# Patient Record
Sex: Male | Born: 1965 | Race: Black or African American | Hispanic: No | Marital: Married | Smoking: Never smoker
Health system: Southern US, Community
[De-identification: ages and names within clinical notes are randomized; demographics above are authoritative.]

## PROBLEM LIST (undated history)

## (undated) DIAGNOSIS — I10 Essential (primary) hypertension: Secondary | ICD-10-CM

## (undated) DIAGNOSIS — R002 Palpitations: Secondary | ICD-10-CM

## (undated) DIAGNOSIS — E785 Hyperlipidemia, unspecified: Secondary | ICD-10-CM

## (undated) HISTORY — DX: Palpitations: R00.2

## (undated) HISTORY — DX: Hyperlipidemia, unspecified: E78.5

## (undated) HISTORY — DX: Essential (primary) hypertension: I10

---

## 2002-03-12 ENCOUNTER — Encounter: Payer: Self-pay | Admitting: Ophthalmology

## 2002-03-12 ENCOUNTER — Ambulatory Visit: Admit: 2002-03-12 | Discharge: 2002-03-12 | Payer: Self-pay | Admitting: Ophthalmology

## 2003-09-10 ENCOUNTER — Encounter: Payer: Self-pay | Admitting: Emergency Medicine

## 2003-09-10 ENCOUNTER — Ambulatory Visit (HOSPITAL_COMMUNITY): Admission: RE | Admit: 2003-09-10 | Discharge: 2003-09-10 | Payer: Self-pay | Admitting: Emergency Medicine

## 2013-06-30 ENCOUNTER — Encounter: Payer: Self-pay | Admitting: Cardiology

## 2013-06-30 ENCOUNTER — Ambulatory Visit (INDEPENDENT_AMBULATORY_CARE_PROVIDER_SITE_OTHER): Payer: PRIVATE HEALTH INSURANCE | Admitting: Cardiology

## 2013-06-30 VITALS — BP 130/100 | HR 64 | Ht 74.5 in | Wt 230.9 lb

## 2013-06-30 DIAGNOSIS — R9431 Abnormal electrocardiogram [ECG] [EKG]: Secondary | ICD-10-CM | POA: Insufficient documentation

## 2013-06-30 DIAGNOSIS — R002 Palpitations: Secondary | ICD-10-CM | POA: Insufficient documentation

## 2013-06-30 NOTE — Progress Notes (Signed)
06/30/2013   PCP: Kari Baars, MD   Chief Complaint  Patient presents with  . NP-CONSULT    PALPITATIONS    Primary Cardiologist: Dr. 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 They started a day or so ago he was concerned it was atrial fibrillation.  No chest pain, no shortness of breath no lightheadedness or dizziness. Recent complete physical by Dr. Eric Form and lab work was done at that time.  Over the weekend he exercised Saturday and Sunday on the treadmill getting his heart rate up to 120 beats per minute without any complications.  EKG today is abnormal from l2008 with T-wave inversions in V3 V4 V5 continued T wave inversions in II,III, & aVF he does have PACs on the monitor.  Allergies  Allergen Reactions  . Promethazine     Current Outpatient Prescriptions  Medication Sig Dispense Refill  . ibuprofen (ADVIL,MOTRIN) 400 MG tablet Take 400 mg by mouth as needed for pain.       No current facility-administered medications for this visit.    History reviewed. No pertinent past medical history.  borderline hypertension. He's had some readings as high as the 190s/100s, but when he checks his blood pressures at home, there always in the 120s to 130s systolic. He also announces that he may have borderline dyslipidemia.   History reviewed. No pertinent past surgical history. He does have a family history of premature coronary disease. He says that his mother had a heart attack in her 69s.  YQM:VHQIONG:EX colds or fevers, no weight changes Skin:no rashes or ulcers HEENT:no blurred vision, no congestion CV:see HPI PUL:see HPI GI:no diarrhea constipation or melena, no indigestion GU:no hematuria, no dysuria MS:no joint pain, no claudication Neuro:no syncope, no lightheadedness Endo:no diabetes, no thyroid disease Has one cup of caffeine a day Exercises without chest pain or SOB  PHYSICAL EXAM BP  130/100  Pulse 64  Ht 6' 2.5" (1.892 m)  Wt 230 lb 14.4 oz (104.736 kg)  BMI 29.26 kg/m2 General:Pleasant affect, NAD Skin:Warm and dry, brisk capillary refill HEENT:normocephalic, sclera clear, mucus membranes moist Neck:supple, no JVD, no bruits  Heart:S1S2 RRR without murmur, gallup, rub or click, occ premature beat Lungs:clear without rales, rhonchi, or wheezes BMW:UXLK, non tender, + BS, do not palpate liver spleen or masses Ext:no lower ext edema, 2+ pedal pulses, 2+ radial pulses Neuro:alert and oriented, MAE, follows commands, + facial symmetry   EKG:see above  ASSESSMENT AND PLAN Palpitations PACs here in office.  No atrial fib.  If symptoms do not improve then he may need event monitor.  Recent labs with Dr. Clelia Croft.  Echo with normal LV function, 09/2012.  Abnormal EKG Has abnormal EKG but no chest pain and can achieve HR of 160 on treadmill without complications. Dr. Herbie Baltimore discussed with pt. Do not feel need for stress test at this time.     I spent ~15-20 minutes talking with the patient, who is an ER MD.  He is very fit & healthy, exercises ~3 days a week, routinely getting his HR well into the 160s and denies any CP or dyspnea.    He comes in today due to increased frequency of palpitations over the past few days. He has had family friends in town, and has not been getting very good sleep. He also notes that work has been somewhat stressful of late. Over last few days he's been having these frequent spells  of palpitations with brief pauses and at the clinic at attention. But not that run together. He definitely is not felt any tachyarrhythmias. His main concern is that he wanted x-ray did not have atrial fibrillation basilar frequency of his premature beats.  He has an EKG here that the a sinus rhythm with PAC. He does have some somewhat ischemic appearing ST-T wave changes in the anterior leads on his ECG, which if wasn't in any one else without his significant exercise  history and no evidence of angina, I would be somewhat concerned with what is considered evaluating this the stress test. However his daily activity at the gym is more than a stress test would be.  Exam is relatively benign and normal. It personally palpated at least 15 PACs during about 2 minutes of palpating his pulse.  I think we safely say that these PACs are not at her fibrillation. He was relieved with this, and does not really want to go through any further evaluation with a monitor or stress test.  At no time has he ever had any chest tightness chest pressure.  He'll Sunday followup with Dr. Allyson Sabal as scheduled.  Marykay Lex, MD

## 2013-06-30 NOTE — Assessment & Plan Note (Addendum)
Has abnormal EKG but no chest pain and can achieve HR of 160 on treadmill without complications. Dr. Herbie Baltimore discussed with pt, who does not feel that there is a need for stress test at this time.

## 2013-06-30 NOTE — Assessment & Plan Note (Signed)
PACs here in office.  No atrial fib.  If symptoms do not improve then he may need event monitor.  Recent labs with Dr. Clelia Croft.  Echo with normal LV function, 09/2012.

## 2013-06-30 NOTE — Patient Instructions (Addendum)
Call if increasing palpitations or any problems.

## 2013-07-03 ENCOUNTER — Encounter: Payer: Self-pay | Admitting: Cardiovascular Disease

## 2014-02-13 ENCOUNTER — Ambulatory Visit (INDEPENDENT_AMBULATORY_CARE_PROVIDER_SITE_OTHER): Payer: PRIVATE HEALTH INSURANCE | Admitting: Internal Medicine

## 2014-02-13 VITALS — BP 142/88 | HR 67 | Ht 74.0 in | Wt 228.0 lb

## 2014-02-13 DIAGNOSIS — I493 Ventricular premature depolarization: Secondary | ICD-10-CM

## 2014-02-13 DIAGNOSIS — I4949 Other premature depolarization: Secondary | ICD-10-CM

## 2014-02-13 DIAGNOSIS — R9431 Abnormal electrocardiogram [ECG] [EKG]: Secondary | ICD-10-CM

## 2014-02-13 DIAGNOSIS — R002 Palpitations: Secondary | ICD-10-CM

## 2014-02-13 DIAGNOSIS — I10 Essential (primary) hypertension: Secondary | ICD-10-CM

## 2014-02-13 MED ORDER — METOPROLOL SUCCINATE ER 25 MG PO TB24: 12.5000 mg | ORAL_TABLET | Freq: Every day | ORAL | Status: DC

## 2014-02-13 NOTE — Patient Instructions (Signed)
STOP amlodipine.   START Toprol XL 12.5mg  once daily.   Your physician has requested that you have an exercise tolerance test. For further information please visit [email or web address]. Please also follow instruction sheet, as given. PLEASE TAKE TOPROL THE DAY OF THIS TEST.   Please schedule a follow up visit with Dr. Allyson SabalBerry after your test.

## 2014-02-16 ENCOUNTER — Encounter: Payer: Self-pay | Admitting: Internal Medicine

## 2014-02-16 DIAGNOSIS — I10 Essential (primary) hypertension: Secondary | ICD-10-CM | POA: Insufficient documentation

## 2014-02-16 NOTE — Progress Notes (Signed)
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 He reports that he had palpitations in the past for which she is fairly symptomatic. They seem to come and go to periods of time are more noticeable than others. His only other past medical history significant for hypertension, for which she takes low-dose amlodipine. He reports being fairly intolerant to a number of different medications. He continues to be active in fact exercises regularly. When he noticed more palpitations he other week he started to exercise more to see if he could suppress the palpitations, which she was able to do to some extent. Yesterday however the palpitations were so intense and frequent that he felt that he needed to come to the office, instead of the emergency department.  He denies any chest pain with this, or with exertion.  PMHx:  Past Medical History  Diagnosis Date  . Hypertension   . Palpitations     History reviewed. No pertinent past surgical history.  FAMHx:  Family History  Problem Relation Age of Onset  . Hypertension Father   . Hypertension Mother   . CAD Neg Hx     SOCHx:   reports that he has never smoked. He has never used smokeless tobacco. He reports that he drinks about 3.0 ounces of alcohol per week. He reports that he does not use illicit drugs.  ALLERGIES:  Allergies  Allergen Reactions  . Promethazine     ROS: A comprehensive review of systems was negative except for: Cardiovascular: positive for palpitations  HOME MEDS: Current Outpatient Prescriptions  Medication Sig Dispense Refill  . ibuprofen (ADVIL,MOTRIN) 400 MG tablet Take 400 mg by mouth as needed for pain.      . metoprolol succinate (TOPROL-XL) 25 MG 24 hr tablet Take 0.5 tablets (12.5 mg total) by mouth daily.  15 tablet  6    No current facility-administered medications for this visit.    LABS/IMAGING: No results found for this or any previous visit (from the past 48 hour(s)). No results found.  VITALS: BP 142/88  Pulse 67  Ht 6\' 2"  (1.88 m)  Wt 228 lb (103.42 kg)  BMI 29.26 kg/m2  EXAM: General appearance: alert and no distress Neck: no carotid bruit and no JVD Lungs: clear to auscultation bilaterally Heart: regular rate and rhythm, S1, S2 normal and occasional irregularity is noted Abdomen: soft, non-tender; bowel sounds normal; no masses,  no organomegaly Extremities: extremities normal, atraumatic, no cyanosis or edema Pulses: 2+ and symmetric Skin: Skin color, texture, turgor normal. No rashes or lesions Neurologic: Grossly normal Psych: Mildly anxious  EKG: Sinus rhythm at 69 with PVCs, inferior and lateral T-wave inversions with voltage criteria for LVH  ASSESSMENT: 1. Palpitations/PVCs 2. Abnormal EKG with inferior and lateral T-wave inversions-unchanged from prior EKGs 3. No prior stress testing  PLAN: 1.   Eddie [name] is indeed having PVCs for which is very symptomatic. He does think that this was suppressed with exercise, which would suggest they're more benign. He's never had a formal stress test therefore I recommend an exercise treadmill stress test. In addition, I convinced him to try a low-dose beta blocker. I would recommend discontinuing his Norvasc and starting Toprol-XL 12.5 mg daily. I would like him to continue this medication even on the day of his stress test to see how  it affects his PVCs in exercise tolerance.  He will followup with Dr. Allyson SabalBerry per his request.  Eddie NoseKenneth C. Hilty, MD, [facility] Southern New MexicoFACC Attending Cardiologist [facility]  [name],Eddie C 02/16/2014, 7:00 PM

## 2014-02-25 ENCOUNTER — Other Ambulatory Visit: Payer: Self-pay | Admitting: *Deleted

## 2014-02-25 DIAGNOSIS — I517 Cardiomegaly: Secondary | ICD-10-CM

## 2014-03-03 ENCOUNTER — Encounter (HOSPITAL_COMMUNITY): Payer: PRIVATE HEALTH INSURANCE

## 2014-03-12 ENCOUNTER — Ambulatory Visit (HOSPITAL_COMMUNITY): Payer: PRIVATE HEALTH INSURANCE

## 2014-03-16 ENCOUNTER — Ambulatory Visit (HOSPITAL_COMMUNITY)
Admission: RE | Admit: 2014-03-16 | Discharge: 2014-03-16 | Disposition: A | Discharge status: Home / Self Care | Payer: PRIVATE HEALTH INSURANCE | Source: Ambulatory Visit | Attending: Cardiovascular Disease | Admitting: Cardiovascular Disease

## 2014-03-16 DIAGNOSIS — I517 Cardiomegaly: Secondary | ICD-10-CM

## 2014-03-16 NOTE — Progress Notes (Signed)
2D Echo Performed 03/16/2014    Ivo Moga, RCS  

## 2014-03-18 ENCOUNTER — Encounter: Payer: Self-pay | Admitting: Cardiovascular Disease

## 2014-03-18 ENCOUNTER — Ambulatory Visit (INDEPENDENT_AMBULATORY_CARE_PROVIDER_SITE_OTHER): Payer: PRIVATE HEALTH INSURANCE | Admitting: Cardiovascular Disease

## 2014-03-18 VITALS — BP 140/80 | HR 64 | Ht 74.0 in | Wt 234.0 lb

## 2014-03-18 DIAGNOSIS — E785 Hyperlipidemia, unspecified: Secondary | ICD-10-CM | POA: Insufficient documentation

## 2014-03-18 DIAGNOSIS — R002 Palpitations: Secondary | ICD-10-CM

## 2014-03-18 DIAGNOSIS — I10 Essential (primary) hypertension: Secondary | ICD-10-CM

## 2014-03-18 DIAGNOSIS — R9431 Abnormal electrocardiogram [ECG] [EKG]: Secondary | ICD-10-CM

## 2014-03-18 NOTE — Assessment & Plan Note (Signed)
Followed by his PCP 

## 2014-03-18 NOTE — Patient Instructions (Signed)
Dr Allyson SabalBerry will see back as needed.

## 2014-03-18 NOTE — Assessment & Plan Note (Signed)
Consistent with left ventricular hypertrophy with strain pattern. Recent 2D  echo did confirm mild concentric LVH.

## 2014-03-18 NOTE — Assessment & Plan Note (Signed)
Unifocal PVCs in a trigeminal pattern in the past. I believe these are bradycardic mediated. Since she's been doing more aerobic activity these have become less frequent and noticeable

## 2014-03-18 NOTE — Assessment & Plan Note (Signed)
Under good control and her medications 

## 2014-03-18 NOTE — Progress Notes (Signed)
     03/18/2014 Eddie [name]   07/11/1966  [id number]  Primary Physician Martha ClanShaw, William, MD Primary Cardiologist: Runell GessJonathan J. 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 He was seen by Dr. [place]had T. In the recent past because of symptomatic palpitations. Factors include hypertension and hyperlipidemia. He denies chest pain or shortness of breath. He is very active and exercises her on a regular basis. EKG showed bigeminal PVCs and a beta blocker was recommended however the patient try this and felt clinically worse. 2-D echo revealed mild concentric left ventricular hypertrophy which probably explains his abnormal EKG.   Current Outpatient Prescriptions  Medication Sig Dispense Refill  . amLODipine (NORVASC) 2.5 MG tablet Take 2.5 mg by mouth daily.      Marland Kitchen. docusate sodium (COLACE) 100 MG capsule Take 100 mg by mouth daily as needed for mild constipation.      Marland Kitchen. ibuprofen (ADVIL,MOTRIN) 400 MG tablet Take 400 mg by mouth as needed for pain.       No current facility-administered medications for this visit.    Allergies  Allergen Reactions  . Promethazine     History   Social History  . Marital Status: Married    Spouse Name: N/A    Number of Children: N/A  . Years of Education: N/A   Occupational History  . Not on file.   Social History Main Topics  . Smoking status: Never Smoker   . Smokeless tobacco: Never Used     Comment: occ cigar  . Alcohol Use: 3.0 oz/week    6 drink(s) per week  . Drug Use: No  . Sexual Activity: Not on file   Other Topics Concern  . Not on file   Social History Narrative  . No narrative on file     Review of Systems: General: negative for chills, fever, night sweats or weight changes.  Cardiovascular: negative for chest pain, dyspnea on exertion, edema, orthopnea, palpitations, paroxysmal nocturnal dyspnea or  shortness of breath Dermatological: negative for rash Respiratory: negative for cough or wheezing Urologic: negative for hematuria Abdominal: negative for nausea, vomiting, diarrhea, bright red blood per rectum, melena, or hematemesis Neurologic: negative for visual changes, syncope, or dizziness All other systems reviewed and are otherwise negative except as noted above.    Blood pressure 140/80, pulse 64, height 6\' 2"  (1.88 m), weight 234 lb (106.142 kg).  General appearance: alert and no distress Neck: no adenopathy, no carotid bruit, no JVD, supple, symmetrical, trachea midline and thyroid not enlarged, symmetric, no tenderness/mass/nodules Lungs: clear to auscultation bilaterally Heart: regular rate and rhythm, S1, S2 normal, no murmur, click, rub or gallop Extremities: extremities normal, atraumatic, no cyanosis or edema  EKG not performed today  ASSESSMENT AND PLAN:   HTN (hypertension) Under good control and her medications  Hyperlipidemia Followed by his PCP  Palpitations Unifocal PVCs in a trigeminal pattern in the past. I believe these are bradycardic mediated. Since she's been doing more aerobic activity these have become less frequent and noticeable  Abnormal EKG Consistent with left ventricular hypertrophy with strain pattern. Recent 2D  echo did confirm mild concentric LVH.      Runell GessJonathan J. Jolleen Seman MD FACP,FACC,FAHA, [facility] LLCFSCAI 03/18/2014 4:52 PM

## 2016-09-14 ENCOUNTER — Ambulatory Visit (INDEPENDENT_AMBULATORY_CARE_PROVIDER_SITE_OTHER): Payer: PRIVATE HEALTH INSURANCE | Admitting: Neurology

## 2016-09-14 ENCOUNTER — Encounter: Payer: Self-pay | Admitting: Neurology

## 2016-09-14 VITALS — BP 143/90 | HR 80 | Resp 16 | Ht 74.0 in | Wt 236.0 lb

## 2016-09-14 DIAGNOSIS — R351 Nocturia: Secondary | ICD-10-CM

## 2016-09-14 DIAGNOSIS — R0683 Snoring: Secondary | ICD-10-CM

## 2016-09-14 DIAGNOSIS — G4726 Circadian rhythm sleep disorder, shift work type: Secondary | ICD-10-CM | POA: Diagnosis not present

## 2016-09-14 DIAGNOSIS — R51 Headache: Secondary | ICD-10-CM

## 2016-09-14 DIAGNOSIS — R519 Headache, unspecified: Secondary | ICD-10-CM

## 2016-09-14 NOTE — Patient Instructions (Signed)

## 2016-09-14 NOTE — Progress Notes (Signed)
Subjective:    Patient ID: REMIGIO MCMILLON is a 50 y.o. male.  HPI     Huston Foley, MD, PhD [facility] [address], [address] [address] [place], [place] [address]  Dear Dr. Clelia Croft,   I saw your patient, Hezekiah Veltre, upon your kind request, in my neurologic clinic today for initial consultation of his sleep disorder, in particular, concern for underlying obstructive sleep apnea. The patient is unaccompanied today. 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 He has woken himself up with a sense snoring and from his own snoring. 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 He tries to go to bed before midnight, sometimes his shift will last until 1 AM or later and he will go to bed later and has difficulty falling asleep at the time.wakeup time is around 7 AM. He has one side who goes to preschool. He is not aware of any family history of OSA. He has nocturia about 3-4 times per night but attributes this to drinking a lot of water. He tries to work out at least 3 days a week. He has occasional trouble falling asleep and does not like to take medication, has not tried melatonin lately, tried Ambien in the past but did not like the way it made him feel. Secondary to his work-related schedule, his sleep will be irregular, he does not always wake up rested, denies restless leg symptoms and is not aware of any twitching or kicking in his sleep. He has very occasional morning headaches, sometimes once a week however. He does not smoke, drinks 1 cup of coffee per day, does not use any illicit drugs and drinks alcohol maybe  6 drinks a week. His Epworth sleepiness score is 3 out of 24 today, his fatigue score is 12 out of 63. He tries not to nap but when he naps he tries to limit his sleep to less than 1 hour. I reviewed your office note from 08/25/2016, which you kindly included.  His Past Medical History Is Significant For: Past Medical History:  Diagnosis Date  . Hyperlipidemia   . Hypertension   . Palpitations    PVCs    His Past Surgical History Is Significant For: No past surgical history on file.  His Family History Is Significant For: Family History  Problem Relation Age of Onset  . Hypertension Father   . Hypertension Mother   . CAD Neg Hx     His Social History Is Significant For: Social History   Social History  . Marital status: Married    Spouse name: N/A  . Number of children: 1  . Years of education: MD   Occupational History  . [employer]    Social History Main Topics  . Smoking status: Never Smoker  . Smokeless tobacco: Never Used     Comment: occ cigar  . Alcohol use 3.0 oz/week    6 drink(s) per week  . Drug use: No  . Sexual activity: Not Asked   Other Topics Concern  . None   Social History Narrative   Drinks about 1.5 caffeine drinks a day     His Allergies Are:  Allergies  Allergen  Reactions  . Promethazine   :   His Current Medications Are:  Outpatient Encounter Prescriptions as of 09/14/2016  Medication Sig  . amLODipine (NORVASC) 2.5 MG tablet Take 2.5 mg by mouth daily.  Marland Kitchen docusate sodium (COLACE) 100 MG capsule Take 100 mg by mouth daily as needed for mild constipation.  Marland Kitchen ibuprofen (ADVIL,MOTRIN) 400 MG tablet Take 400 mg by mouth as needed for pain.  . rosuvastatin (CRESTOR) 10 MG tablet    No facility-administered encounter medications on file as of 09/14/2016.   :  Review of Systems:  Out of a complete 14 point review of systems, all are reviewed and negative with the exception of these symptoms as listed below:   Review of Systems   Neurological:       Patient has trouble falling and staying asleep, snoring, sometimes wakes up feeling tired, morning headaches, sometimes has daytime fatigue, sometimes takes naps.  Shift work: hours very   Epworth Sleepiness Scale 0= would never doze 1= slight chance of dozing 2= moderate chance of dozing 3= high chance of dozing  Sitting and reading:1 Watching TV:0 Sitting inactive in a public place (ex. Theater or meeting):0 As a passenger in a car for an hour without a break:1 Lying down to rest in the afternoon:1 Sitting and talking to someone:0 Sitting quietly after lunch (no alcohol):0 In a car, while stopped in traffic:0 Total:3  Objective:  Neurologic Exam  Physical Exam Physical Examination:   Vitals:   09/14/16 1525  BP: (!) 143/90  Pulse: 80  Resp: 16   General Examination: The patient is a very pleasant 50 y.o. male in no acute distress. He appears well-developed and well-nourished and very well groomed.    HEENT: Normocephalic, atraumatic, pupils are equal, round and reactive to light and accommodation. Funduscopic exam is normal with sharp disc margins noted. Contacts in place. Extraocular tracking is good without limitation to gaze excursion or nystagmus noted. Normal smooth pursuit is noted. Hearing is grossly intact. Tympanic membranes are clear bilaterally. Face is symmetric with normal facial animation and normal facial sensation. Speech is clear with no dysarthria noted. There is no hypophonia. There is no lip, neck/head, jaw or voice tremor. Neck is supple with full range of passive and active motion. There are no carotid bruits on auscultation. Oropharynx exam reveals: no mouth dryness, good dental hygiene and mild airway crowding, due to tonsils in place, uvula wider . Mallampati is class I. Tongue protrudes centrally and palate elevates symmetrically. Tonsils are 1+ in size. Neck size is 16.5 inches. He has a Mild overbite. Nasal inspection reveals no  significant nasal mucosal bogginess or redness and no septal deviation.   Chest: Clear to auscultation without wheezing, rhonchi or crackles noted.  Heart: S1+S2+0, regular and normal without murmurs, rubs or gallops noted.   Abdomen: Soft, non-tender and non-distended with normal bowel sounds appreciated on auscultation.  Extremities: There is no pitting edema in the distal lower extremities bilaterally. Pedal pulses are intact.  Skin: Warm and dry without trophic changes noted.   Musculoskeletal: exam reveals no obvious joint deformities, tenderness or joint swelling or erythema.   Neurologically:  Mental status: The patient is awake, alert and oriented in all 4 spheres. His immediate and remote memory, attention, language skills and fund of knowledge are appropriate. There is no evidence of aphasia, agnosia, apraxia or anomia. Speech is clear with normal prosody and enunciation. Thought process is linear. Mood is normal and affect is normal.  Cranial nerves  II - XII are as described above under HEENT exam. In addition: shoulder shrug is normal with equal shoulder height noted. Motor exam: Normal bulk, strength and tone is noted. There is no drift, tremor or rebound. Romberg is negative. Reflexes are 2+ throughout. Fine motor skills and coordination: intact with normal finger taps, normal hand movements, normal rapid alternating patting, normal foot taps and normal foot agility.  Cerebellar testing: No dysmetria or intention tremor on finger to nose testing. Heel to shin is unremarkable bilaterally. There is no truncal or gait ataxia.  Sensory exam: intact light touch, pinprick, vibration, temperature sense.  Gait, station, balance, posture: He stands easily. No veering to one side is noted. No leaning to one side is noted. Posture is age-appropriate and stance is narrow based. Gait shows normal stride length and normal pace. No problems turning are noted. Tandem walk is unremarkable.    Assessment and Plan:  In summary, Toy Bakernthony T Germani is a very pleasant 50 y.o.-year old male with an underlying medical history of hypertension, PVCs, left ventricular hypertrophy, hyperlipidemia, history of gout, recurrent iritis and overweight state, whose history and physical exam are concerning for obstructive sleep apnea (OSA). I had a long chat with the patient about my findings and the diagnosis of OSA, its prognosis and treatment options. We talked about medical treatments, surgical interventions and non-pharmacological approaches. I explained in particular the risks and ramifications of untreated moderate to severe OSA, especially with respect to developing cardiovascular disease down the Road, including congestive heart failure, difficult to treat hypertension, cardiac arrhythmias, or stroke. Even type 2 diabetes has, in part, been linked to untreated OSA. Symptoms of untreated OSA include daytime sleepiness, memory problems, mood irritability and mood disorder such as depression and anxiety, lack of energy, as well as recurrent headaches, especially morning headaches. We talked about trying to maintain a healthy lifestyle in general, as well as the importance of weight control. I encouraged the patient to eat healthy, exercise daily and keep well hydrated, to keep a scheduled bedtime and wake time routine, to not skip any meals and eat healthy snacks in between meals. I advised the patient not to drive when feeling sleepy. Of course a confounding factor is his variable work schedule and shift work sleep disorder. I recommended the following at this time: sleep study with potential positive airway pressure titration. (We will score hypopneas at 3% and split the sleep study into diagnostic and treatment portion, if the estimated. 2 hour AHI is >15/h).   I explained the sleep test procedure to the patient and also outlined possible surgical and non-surgical treatment options of OSA, including the use  of a custom-made dental device (which would require a referral to a specialist dentist or oral surgeon), upper airway surgical options, such as pillar implants, radiofrequency surgery, tongue base surgery, and UPPP (which would involve a referral to an ENT surgeon). Rarely, jaw surgery such as mandibular advancement may be considered.  I also explained the CPAP treatment option to the patient, who indicated that he would be willing to try CPAP if the need arises, but would likely prefer a dental appliance. I answered all his questions today and the patient was in agreement. I would like to see him back after the sleep study is completed and encouraged him to call with any interim questions, concerns, problems or updates.   Thank you very much for allowing me to participate in the care of this nice patient. If I can be of any  further assistance to you please do not hesitate to call me at [phone].  Sincerely,   Huston Foley, MD, PhD

## 2016-11-06 ENCOUNTER — Ambulatory Visit (INDEPENDENT_AMBULATORY_CARE_PROVIDER_SITE_OTHER): Payer: PRIVATE HEALTH INSURANCE | Admitting: Neurology

## 2016-11-06 DIAGNOSIS — G4733 Obstructive sleep apnea (adult) (pediatric): Secondary | ICD-10-CM | POA: Diagnosis not present

## 2016-11-06 DIAGNOSIS — G4761 Periodic limb movement disorder: Secondary | ICD-10-CM

## 2016-11-06 DIAGNOSIS — G4720 Circadian rhythm sleep disorder, unspecified type: Secondary | ICD-10-CM

## 2016-11-21 NOTE — Procedures (Signed)
PATIENT'S NAME:  Eddie [name], Fredric DOB:      12-04-1966      MR#:    [id number]     DATE OF RECORDING: 11/06/2016 REFERRING M.D.:  Carolin CoyWillam Shaw, MD Study Performed:   Baseline Polysomnogram HISTORY:  50 year old man with an underlying medical history of hypertension, PVCs, left ventricular hypertrophy, hyperlipidemia, history of gout, recurrent iritis and overweight state, who reports snoring and excessive daytime somnolence as well as nocturia, irregular sleep secondary to shift work. The patient endorsed the Epworth Sleepiness Scale at 3/24 points. The patient's weight 236 pounds with a height of 74 (inches), resulting in a BMI of 30.3 kg/m2. The patient's neck circumference measured 16.5 inches.  CURRENT MEDICATIONS: Amlodipine, Docusate Sodium, Ibuprofen and Rosuvastatin   PROCEDURE:  This is a multichannel digital polysomnogram utilizing the Somnostar 11.2 system.  Electrodes and sensors were applied and monitored per AASM Specifications.   EEG, EOG, Chin and Limb EMG, were sampled at 200 Hz.  ECG, Snore and Nasal Pressure, Thermal Airflow, Respiratory Effort, CPAP Flow and Pressure, Oximetry was sampled at 50 Hz. Digital video and audio were recorded.      BASELINE STUDY  Lights Out was at 22:39 and Lights On at 05:13.  Total recording time (TRT) was 395 minutes, with a total sleep time (TST) of 326.5 minutes.   The patient's sleep latency was 23 minutes.  REM latency was 77 minutes.  The sleep efficiency was 82.7 %.     SLEEP ARCHITECTURE: WASO (Wake after sleep onset) was 53.5 minutes with mild to moderate sleep fragmentation noted.  There were 18 minutes in Stage N1, 219 minutes Stage N2, 0 minutes Stage N3 and 89.5 minutes in Stage REM.  The percentage of Stage N1 was 5.5%, Stage N2 was 67.1%, which is mildly increased, Stage N3 was absent and Stage R (REM sleep) was 27.4%, which very mildly increased.  Audio and video analysis did not show any abnormal or unusual movements, behaviors,  phonations or vocalizations.  The patient took 1 bathroom break. Mild to moderate snoring was noted. The EKG was in keeping with normal sinus rhythm (NSR).  RESPIRATORY ANALYSIS:  There were a total of 30 respiratory events:  0 obstructive apneas, 0 central apneas and 0 mixed apneas with a total of 0 apneas and an apnea index (AI) of 0 /hour. There were 30 hypopneas with a hypopnea index of 5.5 /hour. The patient also had 0 respiratory event related arousals (RERAs).      The total APNEA/HYPOPNEA INDEX (AHI) was 5.5/hour and the total RESPIRATORY DISTURBANCE INDEX was 5.5 /hour.  26 events occurred in REM sleep and 8 events in NREM. The REM AHI was 17.4 /hour, versus a non-REM AHI of 1.. The patient spent 100 minutes of total sleep time in the supine position and 227 minutes in non-supine.. The supine AHI was 14.4 versus a non-supine AHI of 1.6.  OXYGEN SATURATION & C02:  The Wake baseline 02 saturation was 92%, with the lowest being 88%. Time spent below 89% saturation equaled 0 minutes.  PERIODIC LIMB MOVEMENTS:   The patient had a total of 182 Periodic Limb Movements.  The Periodic Limb Movement (PLM) index was 33.4 and the PLM Arousal index was .2/hour.   IMPRESSION: 1. Obstructive Sleep Apnea (OSA) 2. Periodic Limb Movement Disorder (PLMD) 3. Dysfunctions associated with sleep stages or arousal from sleep  RECOMMENDATIONS: 1. This study demonstrates overall mild obstructive sleep apnea, moderate in REM sleep, near moderate during supine sleep with a  total AHI borderline elevated at 5.5/hour, REM AHI of 17.4/hour, supine AHI of 14.4/hour and O2 nadir of 88%. Given the patient's medical history and sleep related complaints, a trial of autoPAP at home may be a reasonable option and will be discussed with the patient. A full night CPAP titration can be considered to optimize therapy. Other treatment options may include avoidance of supine sleep position along with weight loss, upper airway or  jaw surgery in selected patients or the use of an oral appliance in certain patients. ENT evaluation and/or consultation with a maxillofacial surgeon or dentist may be feasible and some instances.    2. The patient should be cautioned not to drive, work at heights, or operate dangerous or heavy equipment when tired or sleepy. Review and reiteration of good sleep hygiene measures should be pursued with any patient. 3. Please note that untreated obstructive sleep apnea carries additional perioperative morbidity. Patients with significant obstructive sleep apnea should receive perioperative PAP therapy and the surgeons and particularly the anesthesiologist should be informed of the diagnosis and the severity of the sleep disordered breathing. 4. Moderate PLMs (periodic limb movements of sleep) were noted during the study without significant arousals; clinical correlation is recommended.  5. This study shows sleep fragmentation and abnormal sleep stage percentages; these are nonspecific findings and per se do not signify an intrinsic sleep disorder or a cause for the patient's sleep-related symptoms. Causes include (but are not limited to) the first night effect of the sleep study, circadian rhythm disturbances, medication effect or an underlying mood disorder or medical problem.  6. The patient will be [facility] discussion of the test results and further management strategies. The referring provider will be notified of the test results.  I certify that I have reviewed the entire raw data recording prior to the issuance of this report in accordance with the Standards of Accreditation of the American Academy of Sleep Medicine (AASM).    Huston FoleySaima Anniebelle Devore, MD, PhD Diplomat, American Board of Psychiatry and Neurology  Diplomat, American Board of Sleep Medicine

## 2016-11-21 NOTE — Progress Notes (Signed)
  Patient referred by Dr. Clelia CroftShaw, seen by me on 09/14/16, diagnostic PSG on 11/06/16.    Please call and notify Dr. Freida BusmanAllen that his recent sleep study did confirm the diagnosis of overall borderline/mild obstructive sleep apnea, moderate in REM sleep, O2 nadir of 88% (so mild desats only). We can consider treating to see if he feels better after treatment. To that end I recommend treatment for this in the form of autoPAP, which means, that we don't have to bring him back for a second sleep study with CPAP, but will let him try an autoPAP machine at home, through a DME company (of his choice, or as per insurance requirement). The DME representative will educate him on how to use the machine, how to put the mask on, etc. I have not yet placed an order, please let me know how he wishes to proceed. Thanks,   Huston FoleySaima Tala Eber, MD, PhD [facility])

## 2016-11-22 ENCOUNTER — Telehealth: Payer: Self-pay

## 2016-11-22 DIAGNOSIS — R0683 Snoring: Secondary | ICD-10-CM

## 2016-11-22 DIAGNOSIS — G4726 Circadian rhythm sleep disorder, shift work type: Secondary | ICD-10-CM

## 2016-11-22 NOTE — Telephone Encounter (Signed)
I spoke to patient and asks if he can try a dental device first? Ok to send referral?

## 2016-11-22 NOTE — Telephone Encounter (Signed)
-----   Message from Huston FoleySaima Athar, MD sent at 11/21/2016  8:26 AM EST -----  Patient referred by Dr. Clelia CroftShaw, seen by me on 09/14/16, diagnostic PSG on 11/06/16.    Please call and notify Dr. Freida BusmanAllen that his recent sleep study did confirm the diagnosis of overall borderline/mild obstructive sleep apnea, moderate in REM sleep, O2 nadir of 88% (so mild desats only). We can consider treating to see if he feels better after treatment. To that end I recommend treatment for this in the form of autoPAP, which means, that we don't have to bring him back for a second sleep study with CPAP, but will let him try an autoPAP machine at home, through a DME company (of his choice, or as per insurance requirement). The DME representative will educate him on how to use the machine, how to put the mask on, etc. I have not yet placed an order, please let me know how he wishes to proceed. Thanks,   Huston FoleySaima Athar, MD, PhD [facility])

## 2016-11-23 NOTE — Telephone Encounter (Signed)
Per Dr. Frances FurbishAthar, ok to try dental device. Patient asked if I can send him information on the dentist in the area that make these. He asked that I send these to him in a letter. Confirmed address and mailed letter today.

## 2016-11-30 NOTE — Addendum Note (Signed)
Addended by: Geronimo RunningINKINS, Luisa Louk A on: 11/30/2016 09:51 AM   Modules accepted: Orders

## 2016-11-30 NOTE — Telephone Encounter (Signed)
Received a fax from Dr. Ninetta LightsHatcher, DDS, office, requesting that a referral to them be placed for a dental device and to include sleep study results. Pt had signed a MR release form for us so that we may release this information to Dr. Ninetta LightsHatcher. Referral placed and sleep study faxed along with Dr. Rutha BouchardAthar's RX for dental device and signature.

## 2019-08-19 ENCOUNTER — Telehealth: Payer: Self-pay

## 2019-08-19 DIAGNOSIS — Z136 Encounter for screening for cardiovascular disorders: Secondary | ICD-10-CM

## 2019-08-19 NOTE — Telephone Encounter (Signed)
Lorretta Harp, MD  Annita Brod, RN        Please schedule Dr. Zenia Resides to have a coronary calcium score   JJB     Received staff message from Dr. Gwenlyn Found on 8/25. Order in [place]. Encounter routed to scheduling

## 2019-09-02 ENCOUNTER — Telehealth: Payer: Self-pay

## 2019-10-06 ENCOUNTER — Other Ambulatory Visit: Payer: Self-pay

## 2019-10-06 ENCOUNTER — Ambulatory Visit (INDEPENDENT_AMBULATORY_CARE_PROVIDER_SITE_OTHER)
Admission: RE | Admit: 2019-10-06 | Discharge: 2019-10-06 | Disposition: A | Discharge status: Home / Self Care | Payer: Self-pay | Source: Ambulatory Visit | Attending: Cardiovascular Disease | Admitting: Cardiovascular Disease

## 2019-10-06 DIAGNOSIS — Z136 Encounter for screening for cardiovascular disorders: Secondary | ICD-10-CM

## 2019-12-31 NOTE — Telephone Encounter (Signed)
CALCIUM SCORE COMPLETED 10/06/2019

## 2021-07-30 IMAGING — CT CT HEART SCORING
2 series · 15 of 20 positions shown, 17 images · non-contrast
Comparison: None.
COMPARISON: None.
COMPARISON: None.

Addendum:
EXAM:
OVER-READ INTERPRETATION  CT CHEST

The following report is an over-read performed by radiologist Dr.
Davida Tiger [REDACTED] on 10/06/2019. This
over-read does not include interpretation of cardiac or coronary
anatomy or pathology. The coronary calcium score interpretation by
the cardiologist is attached.
CLINICAL DATA: Risk stratification
Coronary Calcium Score
TECHNIQUE: The patient was scanned on a Siemens Force scanner. Axial
non-contrast 3 mm slices were carried out through the heart. The
data set was analyzed on a dedicated work station and scored using
the Agatson method.
TECHNIQUE: The patient was scanned on a Siemens Somatom 64 slice scanner. Axial

[Series 2: casc 3.0 i36f 2 bestdiast 70 % · axial · 0.38mm/px · z∈[-235,-103]mm · 8 of 58 slices shown, 10 images]
[im 7/58  vessel]
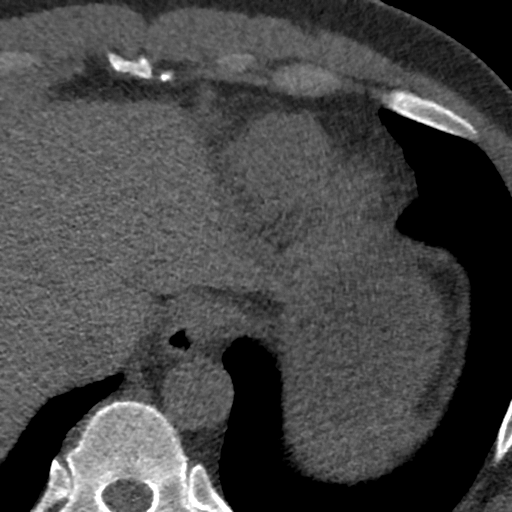
[im 7/58  lung]
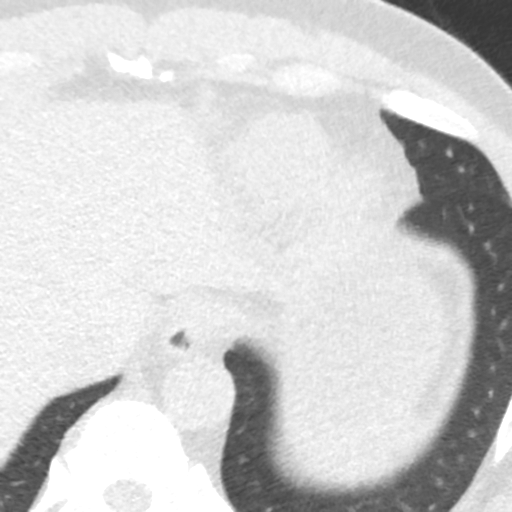
[im 13/58  vessel]
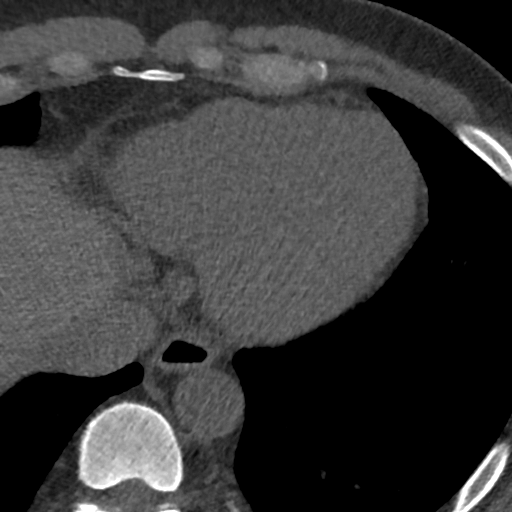
[im 20/58  vessel]
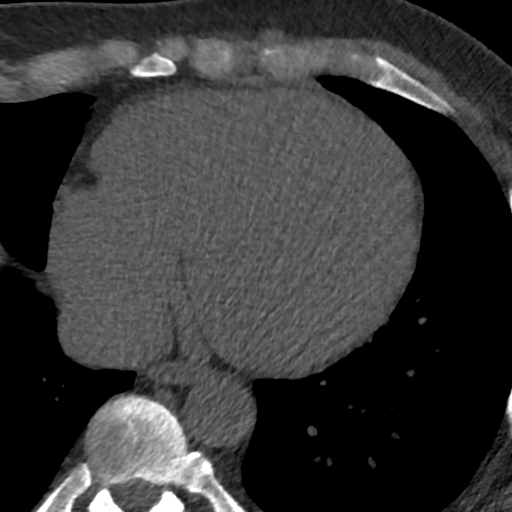
[im 26/58  vessel]
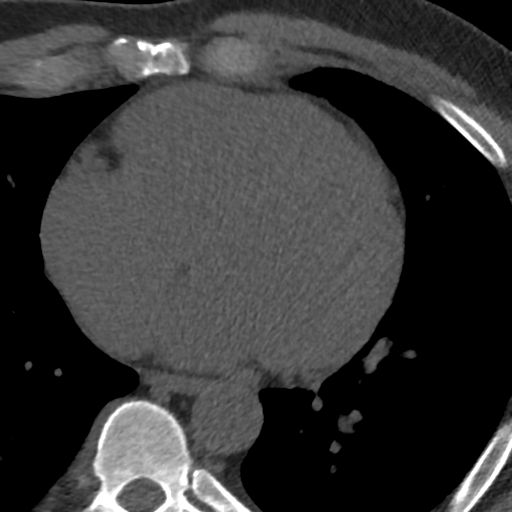
[im 32/58  vessel]
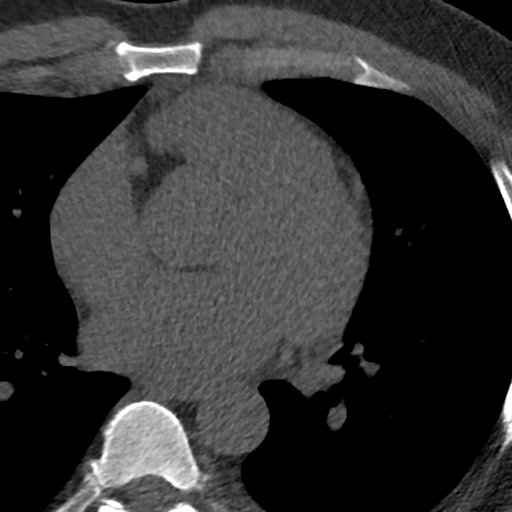
[im 32/58  lung]
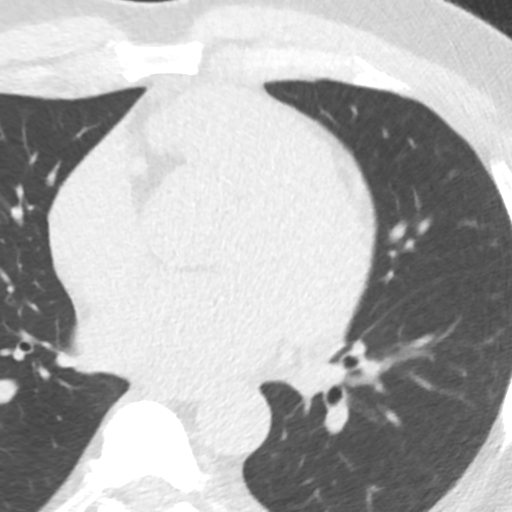
[im 39/58  vessel]
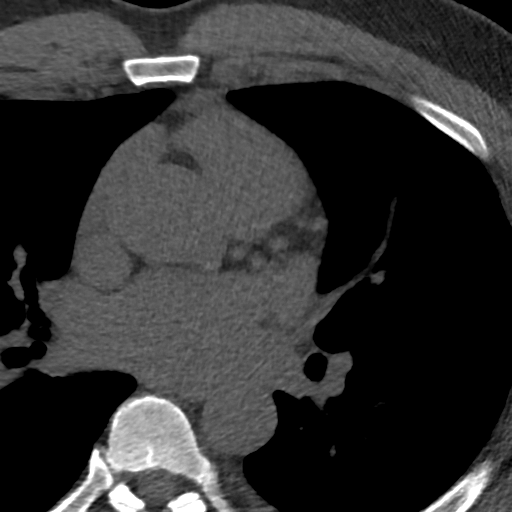
[im 45/58  vessel]
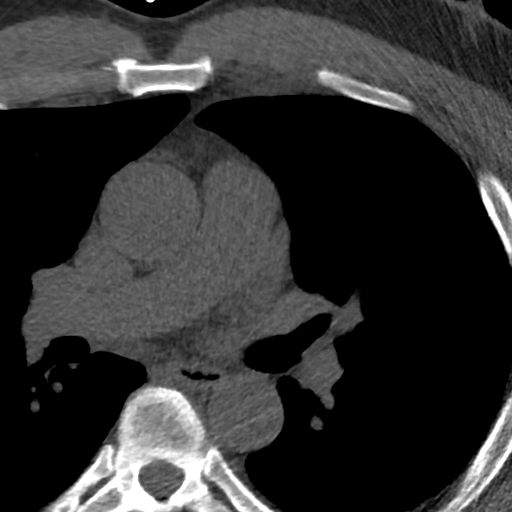
[im 51/58  vessel]
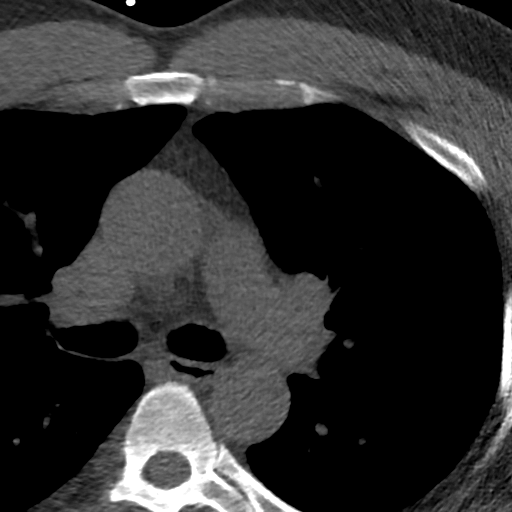

[Series 4: lung st 70 % · axial · 0.77mm/px · z∈[-234,-120]mm · 7 of 58 slices shown]
[im 7/58  lung]
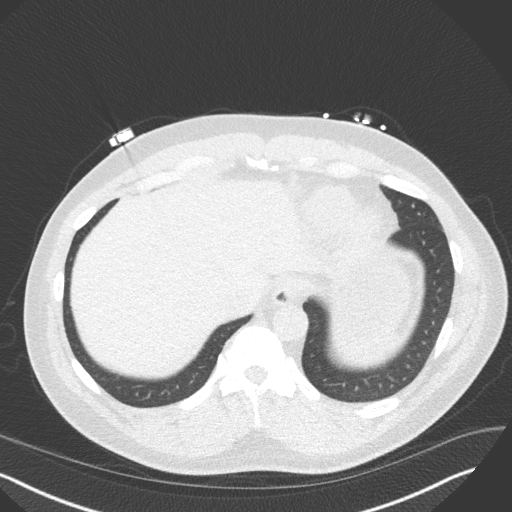
[im 13/58  lung]
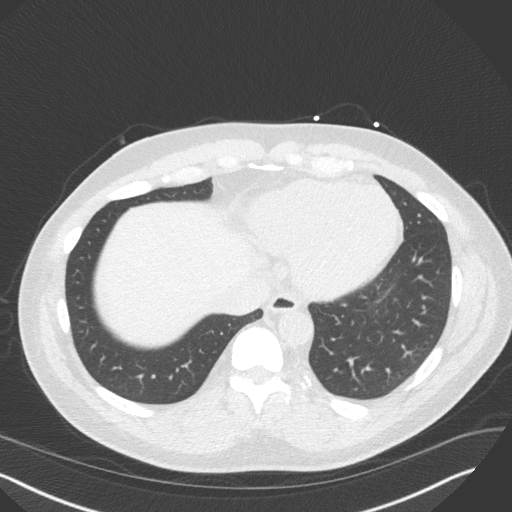
[im 20/58  lung]
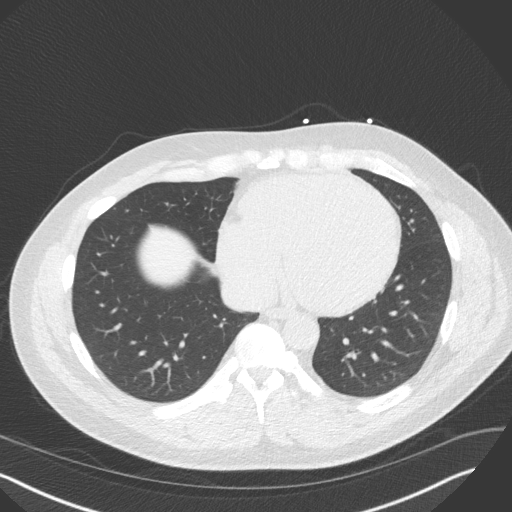
[im 26/58  lung]
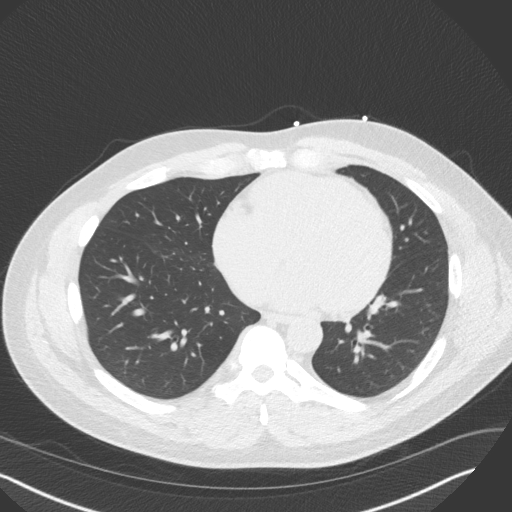
[im 32/58  lung]
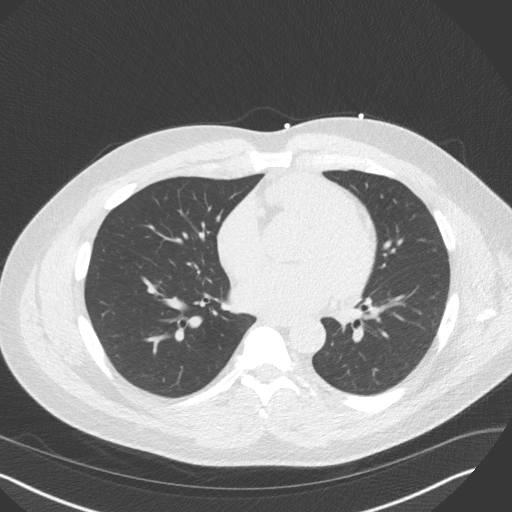
[im 39/58  lung]
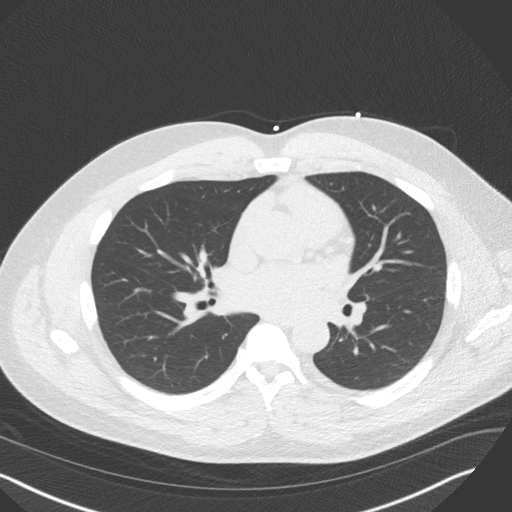
[im 45/58  lung]
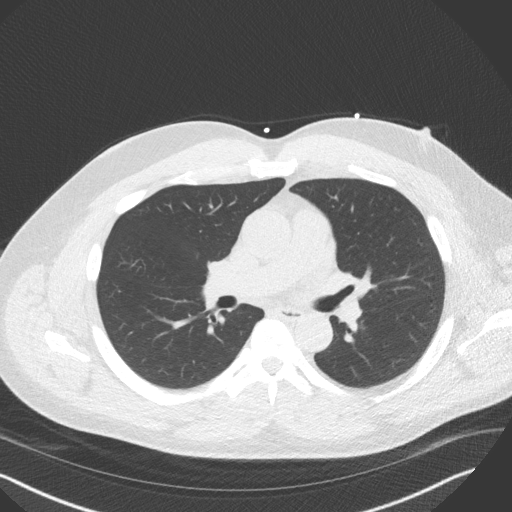

[15 of 20 positions shown; findings below may reference images not displayed]

FINDINGS: Within the visualized portions of the thorax there are no suspicious
appearing pulmonary nodules or masses, there is no acute
consolidative airspace disease, no pleural effusions, no
pneumothorax and no lymphadenopathy. Visualized portions of the
upper abdomen are unremarkable. There are no aggressive appearing
lytic or blastic lesions noted in the visualized portions of the
skeleton.
IMPRESSION: 1. No significant incidental noncardiac findings are noted.
FINDINGS: Non-cardiac: See separate report from [REDACTED].

Ascending Aorta: Normal Caliber.  No Calcifications.

Pericardium: Normal

Coronary arteries: Normal coronary origins.  No calcifications.
IMPRESSION: Coronary calcium score of 0. This was 0 percentile for age and sex
matched control.

[REDACTED]AL DATA:  Risk stratification

EXAM:
Coronary Calcium Score
FINDINGS: Non-cardiac: See separate report from [REDACTED].

Ascending aorta: Normal size; mild calcified plaque in aortic arch.

Pericardium: Normal

Coronary arteries: Normal origin
IMPRESSION: Coronary calcium score of 0. This was 0 percentile for age and sex
matched control.

Yaya Wallis

*** End of Addendum ***
Addendum:
EXAM:
OVER-READ INTERPRETATION  CT CHEST

The following report is an over-read performed by radiologist Dr.
Vik Tonge [REDACTED] on 10/06/2019. This
over-read does not include interpretation of cardiac or coronary
anatomy or pathology. The coronary calcium score interpretation by
the cardiologist is attached.
FINDINGS: Within the visualized portions of the thorax there are no suspicious
appearing pulmonary nodules or masses, there is no acute
consolidative airspace disease, no pleural effusions, no
pneumothorax and no lymphadenopathy. Visualized portions of the
upper abdomen are unremarkable. There are no aggressive appearing
lytic or blastic lesions noted in the visualized portions of the
skeleton.
IMPRESSION: 1. No significant incidental noncardiac findings are noted.
FINDINGS: Non-cardiac: See separate report from [REDACTED].

Ascending Aorta: Normal Caliber.  No Calcifications.

Pericardium: Normal

Coronary arteries: Normal coronary origins.  No calcifications.
IMPRESSION: Coronary calcium score of 0. This was 0 percentile for age and sex
matched control.

Davida Tiger

*** End of Addendum ***
EXAM:
OVER-READ INTERPRETATION  CT CHEST

The following report is an over-read performed by radiologist Dr.
Yaya Wallis [REDACTED] on 10/06/2019. This
over-read does not include interpretation of cardiac or coronary
anatomy or pathology. The coronary calcium score interpretation by
the cardiologist is attached.
FINDINGS: Within the visualized portions of the thorax there are no suspicious
appearing pulmonary nodules or masses, there is no acute
consolidative airspace disease, no pleural effusions, no
pneumothorax and no lymphadenopathy. Visualized portions of the
upper abdomen are unremarkable. There are no aggressive appearing
lytic or blastic lesions noted in the visualized portions of the
skeleton.
IMPRESSION: 1. No significant incidental noncardiac findings are noted.

## 2022-06-28 ENCOUNTER — Ambulatory Visit (INDEPENDENT_AMBULATORY_CARE_PROVIDER_SITE_OTHER): Payer: 59 | Admitting: Neurology

## 2022-06-28 ENCOUNTER — Encounter: Payer: Self-pay | Admitting: Neurology

## 2022-06-28 VITALS — BP 152/92 | HR 72 | Ht 74.0 in | Wt 245.5 lb

## 2022-06-28 DIAGNOSIS — R0683 Snoring: Secondary | ICD-10-CM | POA: Diagnosis not present

## 2022-06-28 DIAGNOSIS — G4730 Sleep apnea, unspecified: Secondary | ICD-10-CM | POA: Diagnosis not present

## 2022-06-28 DIAGNOSIS — R351 Nocturia: Secondary | ICD-10-CM | POA: Diagnosis not present

## 2022-06-28 DIAGNOSIS — F519 Sleep disorder not due to a substance or known physiological condition, unspecified: Secondary | ICD-10-CM | POA: Diagnosis not present

## 2022-06-28 DIAGNOSIS — I10 Essential (primary) hypertension: Secondary | ICD-10-CM | POA: Insufficient documentation

## 2022-06-28 DIAGNOSIS — G4733 Obstructive sleep apnea (adult) (pediatric): Secondary | ICD-10-CM | POA: Insufficient documentation

## 2022-06-28 DIAGNOSIS — G4700 Insomnia, unspecified: Secondary | ICD-10-CM | POA: Insufficient documentation

## 2022-06-28 NOTE — Addendum Note (Signed)
Addended by: Melvyn Novas on: 06/28/2022 04:38 PM   Modules accepted: Orders

## 2022-06-28 NOTE — Patient Instructions (Signed)
Screening for Sleep Apnea  Sleep apnea is a condition in which breathing pauses or becomes shallow during sleep. Sleep apnea screening is a test to determine if you are at risk for sleep apnea. The test includes a series of questions. It will only takes a few minutes. Your health care provider may ask you to have this test in preparation for surgery or as part of a physical exam. What are the symptoms of sleep apnea? Common symptoms of sleep apnea include: Snoring. Waking up often at night. Daytime sleepiness. Pauses in breathing. Choking or gasping during sleep. Irritability. Forgetfulness. Trouble thinking clearly. Depression. Personality changes. Most people with sleep apnea do not know that they have it. What are the advantages of sleep apnea screening? Getting screened for sleep apnea can help: Ensure your safety. It is important for your health care providers to know whether or not you have sleep apnea, especially if you are having surgery or have other long-term (chronic) health conditions. Improve your health and allow you to get a better night's rest. Restful sleep can help you: Have more energy. Lose weight. Improve high blood pressure. Improve diabetes management. Prevent stroke. Prevent car accidents. What happens during the screening? Screening usually includes being asked a list of questions about your sleep quality. Some questions you may be asked include: Do you snore? Is your sleep restless? Do you have daytime sleepiness? Has a partner or spouse told you that you stop breathing during sleep? Have you had trouble concentrating or memory loss? What is your age? What is your neck circumference? To measure your neck, keep your back straight and gently wrap the tape measure around your neck. Put the tape measure at the middle of your neck, between your chin and collarbone. What is your sex assigned at birth? Do you have or are you being treated for high blood  pressure? If your screening test is positive, you are at risk for the condition. Further testing may be needed to confirm a diagnosis of sleep apnea. Where to find more information You can find screening tools online or at your health care clinic. For more information about sleep apnea screening and healthy sleep, visit these websites: Centers for Disease Control and Prevention: www.cdc.gov American Sleep Apnea Association: www.sleepapnea.org Contact a health care provider if: You think that you may have sleep apnea. Summary Sleep apnea screening can help determine if you are at risk for sleep apnea. It is important for your health care providers to know whether or not you have sleep apnea, especially if you are having surgery or have other chronic health conditions. You may be asked to take a screening test for sleep apnea in preparation for surgery or as part of a physical exam. This information is not intended to replace advice given to you by your health care provider. Make sure you discuss any questions you have with your health care provider. Document Revised: 11/19/2020 Document Reviewed: 11/19/2020 Elsevier Patient Education  2023 Elsevier Inc.  

## 2022-06-28 NOTE — Progress Notes (Signed)
SLEEP MEDICINE CLINIC    Provider:  Melvyn Novas, MD  Primary Care Physician:  Cleatis Polka., MD [address] [place] [address]     Referring Provider: Cleatis Polka., Md [address] 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  African American male patient seen here as a referral on 06/28/2022 from PCP. Marland Kitchen  Chief concern according to patient :  Pt referred for sleep consult. Sleep study done in 2017. At the time pt perferred to try dental device first. Here to discuss possibly trying CPAP.    I have the pleasure of seeing  Dr. Toy Baker today, a right -handed Black or African American male with a known OSA sleep disorder. Medical history of hypertension, PVCs, left ventricular hypertrophy, hyperlipidemia, history of gout, recurrent iritis and overweight state, who reports snoring and no excessive daytime somnolence but has Nocturia 3-4  times, not responsive to Flomax, irregular sleep habits secondary to shift work.  He has woken himself up with a sense snoring and from his own snoring. He was started on a dental device.  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 He tries to go to bed before midnight, sometimes his shift will last until 1 AM or later and he will go to bed later and has difficulty falling asleep at the time.wakeup time is around 7 AM.    The patient had the first sleep study in the year 2017  This study demonstrates overall mild obstructive sleep apnea,  moderate in REM sleep, near moderate during supine sleep with a  total AHI borderline elevated at 5.5/hour, REM AHI of 17.4/hour,  The supine AHI was 14.4 versus a non-supine AHI of  1.6. and O2 nadir of 88%.   Moderate PLMs (periodic limb movements of sleep) were noted  during the study without  significant arousals; clinical  correlation is recommended.    Sleep relevant medical history: Nocturia/ myoclonic jerk at seep onset, sensitive to jet lag, could not see a difference in sleep quality after caffeine and ETOH cessation.   Family medical /sleep history: no other family member on CPAP with OSA.  Social history:  Patient is working as an ED physician, has one son, 10 years- of age.  The patient currently works/ used to work in shifts( Chief Technology Officer,) Pets are present: 2 hamsters. Tobacco use; none.  ETOH use 3-4 week,  Caffeine intake in form of Coffee( 1 in AM , instant ) Soda( /) Tea ( /)  no energy drinks. Regular exercise in form of gym/ lifting weights, and 4 hours of cardio.         Sleep habits are as follows: The patient's dinner time is between 6.30 PM when not at work.  The patient goes to bed at 11 PM and continues to sleep for intervals of 2-4  hours, wakes for many bathroom breaks, the first time at 2 AM.   The preferred sleep position is laterally, he sleeps in a guest room. Hypervigilant.  Mind racing. Wife has PLMs RLS.   with the support of 1-2 pillows.  Dreams are reportedly frequent/vivid.  6  AM is the usual  rise time, goes to the [place], even if he wants to sleep longer.  The patient wakes up spontaneously.  He reports feeling refreshed or restored in AM, with symptoms such as dry mouth, morning headaches, and residual fatigue.  Naps are taken frequently before evening shift , lasting from 20 to 30 minutes , and wakes  refreshed.    Review of Systems: Out of a complete 14 system review, the patient complains of only the following symptoms, and all other reviewed systems are negative.:  Fatigue, sleepiness , snoring, fragmented sleep, Insomnia - light sleep, mind racing, worried.    How likely are you to doze in the following situations: 0 = not likely, 1 = slight chance, 2 = moderate chance, 3 = high chance   Sitting and Reading? Watching  Television? Sitting inactive in a public place (theater or meeting)? As a passenger in a car for an hour without a break? Lying down in the afternoon when circumstances permit? Sitting and talking to someone? Sitting quietly after lunch without alcohol? In a car, while stopped for a few minutes in traffic?   Total = 6/ 24 points   FSS endorsed at 14/ 63 points.   Social History   Socioeconomic History   Marital status: Married    Spouse name: Liselott   Number of children: 1   Years of education: MD   Highest education level: Not on file  Occupational History   Occupation: [employer]  Tobacco Use   Smoking status: Never   Smokeless tobacco: Never   Tobacco comments:    occ cigar  Substance and Sexual Activity   Alcohol use: Yes    Alcohol/week: 6.0 standard drinks of alcohol    Types: 6 drink(s) per week   Drug use: No   Sexual activity: Not on file  Other Topics Concern   Not on file  Social History Narrative   Live at home with wife and child   Drinks about 1.5 caffeine drinks a day    R handed   Social Determinants of Health   Financial Resource Strain: Not on file  Food Insecurity: Not on file  Transportation Needs: Not on file  Physical Activity: Not on file  Stress: Not on file  Social Connections: Not on file    Family History  Problem Relation Age of Onset   Hypertension Father    Hypertension Mother    CAD Neg Hx     Past Medical History:  Diagnosis Date   Hyperlipidemia    Hypertension    Palpitations    PVCs    History reviewed. No pertinent surgical history.   Current Outpatient Medications on File Prior to Visit  Medication Sig Dispense Refill   amLODipine (NORVASC) 5 MG tablet Take 7.5 mg by mouth daily.     docusate sodium (COLACE) 100 MG capsule Take 100 mg by mouth daily as needed for mild constipation.     Ibuprofen 200 MG CAPS Take 400 mg by mouth as needed for pain.     olmesartan (BENICAR) 20 MG tablet Take 20 mg by mouth  daily.     rosuvastatin (CRESTOR) 10 MG tablet   10   No current facility-administered medications on file prior to visit.    Allergies  Allergen Reactions   Promethazine     Physical exam:  Today's Vitals   06/28/22 1530  BP: (!) 152/92  Pulse: 72  Weight: 245 lb 8 oz (111.4 kg)  Height: 6\' 2"  (1.88 m)  Body mass index is 31.52 kg/m.   Wt Readings from Last 3 Encounters:  06/28/22 245 lb 8 oz (111.4 kg)  09/14/16 236 lb (107 kg)  03/18/14 234 lb (106.1 kg)     Ht Readings from Last 3 Encounters:  06/28/22 6\' 2"  (1.88 m)  09/14/16 6\' 2"  (1.88 m)  03/18/14 6\' 2"  (1.88 m)      General: The patient is awake, alert and appears not in acute distress. The patient is well groomed. Head: Normocephalic, atraumatic.  Neck is supple.  Mallampati 2,  neck circumference:16.75 inches . Nasal airflow patent.   Retrognathia is seen.  Dental status: intact  Cardiovascular:  Regular rate and cardiac rhythm by pulse,  without distended neck veins. Respiratory: Lungs are clear to auscultation.  Skin:  Without evidence of ankle edema, or rash. Trunk: The patient's posture is erect.   Neurologic exam : The patient is awake and alert, oriented to place and time.   Memory subjective described as intact.  Attention span & concentration ability appears normal.  Speech is fluent,  without  dysarthria, dysphonia or aphasia.  Mood and affect are appropriate.   Cranial nerves: no loss of smell or taste reported  Pupils are equal and briskly reactive to light. Funduscopic exam deferred. .  Extraocular movements in vertical and horizontal planes were intact and without nystagmus. No Diplopia. Visual fields by finger perimetry are intact. Hearing was intact to soft voice and finger rubbing.    Facial sensation intact to fine touch.  Facial motor strength is symmetric and tongue and uvula move midline.  Neck ROM : rotation, tilt and flexion extension were normal for age and shoulder shrug  was symmetrical.    Motor exam:  Symmetric bulk, tone and ROM.   Normal tone without cog wheeling, symmetric grip strength .   Sensory:  Fine touch, pinprick and vibration were tested  and  normal.  Proprioception tested in the upper extremities was normal.   Coordination: Rapid alternating movements in the fingers/hands were of normal speed.  The Finger-to-nose maneuver was intact without evidence of ataxia, dysmetria or tremor.   Gait and station: Patient could rise unassisted from a seated position, walked without assistive device.  Stance is of normal width/ base and the patient turned with 3 steps.  Toe and heel walk were deferred.  Deep tendon reflexes: in the  upper and lower extremities are symmetric and intact.  Babinski response was deferred.       After spending a total time of  40  minutes face to face and additional time for physical and neurologic examination, review of laboratory studies,  personal review of imaging studies, reports and results of other testing and review of referral information / records as far as provided in visit, I have established the following assessments:  1) There has been a weight gain of 12 pounds that seems to correlate to more snoring. There is frequent nocturia, snoring, morning headaches. 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   2) he had very mild apnea in 2017, and in NREM and Non supine sleep there was almost no apnea. Not a candidate for inspire.   3) UARS - sleep apnea, unspecified. HST .    My Plan is to proceed with:  trial of CPAP based on new HST results if indicated.    I would like to thank ., MD and Cleotis Nipper., Md [address]  [place],  [place] [address] for allowing me to meet with and to take care of this pleasant patient.   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   I plan to follow up either personally or through our NP within 2-3 months     Electronically signed by: Melvyn Novas, MD 06/28/2022 4:01 PM  [facility] Board certified by The [employer] of Sleep Medicine and Diplomate of the [employer] of Sleep Medicine. Board certified In Neurology through the ABPN, Fellow of the [employer] of Neurology. Medical Director of [facility].

## 2022-07-06 ENCOUNTER — Ambulatory Visit: Payer: 59 | Admitting: Neurology

## 2022-07-06 DIAGNOSIS — R351 Nocturia: Secondary | ICD-10-CM

## 2022-07-06 DIAGNOSIS — G4733 Obstructive sleep apnea (adult) (pediatric): Secondary | ICD-10-CM | POA: Diagnosis not present

## 2022-07-06 DIAGNOSIS — R0683 Snoring: Secondary | ICD-10-CM

## 2022-07-06 DIAGNOSIS — I10 Essential (primary) hypertension: Secondary | ICD-10-CM

## 2022-07-06 DIAGNOSIS — F519 Sleep disorder not due to a substance or known physiological condition, unspecified: Secondary | ICD-10-CM

## 2022-07-06 DIAGNOSIS — G4730 Sleep apnea, unspecified: Secondary | ICD-10-CM

## 2022-07-11 ENCOUNTER — Telehealth: Payer: Self-pay | Admitting: Neurology

## 2022-07-11 ENCOUNTER — Institutional Professional Consult (permissible substitution): Payer: PRIVATE HEALTH INSURANCE | Admitting: Neurology

## 2022-07-11 NOTE — Progress Notes (Signed)
[facility] T. 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      Epworth sleepiness score:6 /24.   BMI: 31.4kg/m   Neck Circumference: 17"   FINDINGS:   Sleep Summary:   Total Recording Time (hours, min): Amounted to 7 hours and 53 minutes of which 6 hours and 54 minutes was a total calculated sleep time.           Percent REM (%):   25.3%                                     Respiratory Indices:   Calculated pAHI (per hour): 35.6/h                           REM pAHI:   60.5/h                                              NREM pAHI:    27.1/h                          Positional AHI: The patient slept mostly in left lateral position associated with an AHI of 3% followed by right lateral position with an AHI of 46.2% in supine sleep with an AHI of 39.2%.    Snoring volume was low; the mean volume was 40 dB and only measured for 1.7% of total sleep time.                                                Oxygen Saturation Statistics:   O2 Saturation Range (%): Between a nadir at 89 and a maximum of 99% , mean saturation was 93%                                      O2 Saturation (minutes) <89%: 0 minutes          Pulse Rate Statistics:   Pulse Mean (bpm): 54 bpm                Pulse Range:    Between 48 bpm and a maximum of 81 bpm.  Please note that these data cannot reflect a cardiac rhythm only the cardiac rate.             IMPRESSION:  This HST confirms the presence of severe obstructive sleep apnea with a strong REM sleep accentuation.  There is no associated hypoxemia, heart rate showed a bradycardic trend.   RECOMMENDATION: Immediate therapy by positive airway pressure is recommended.  The patient will be  furnished with a autotitration device between 7 and 18 cmH2O pressure 2 cm EPR, heated humidification and mask of his choice and comfort.  I prefer a Immunologist such  as [employer] or [employer].    INTERPRETING PHYSICIAN:   Melvyn Novas, MD   Medical Director of [facility].

## 2022-07-11 NOTE — Progress Notes (Signed)
Calculated pAHI (per hour): 35.6/h  REM pAHI: 60.5/h  NREM pAHI:27.1/h  IMPRESSION:  This HST confirms the presence of severe obstructive sleep apnea with a strong REM sleep accentuation.  There is no associated hypoxemia, heart rate showed a bradycardic trend.  RECOMMENDATION: Immediate therapy by positive airway pressure is recommended.  The patient will be furnished with a autotitration device between 7 and 18 cmH2O pressure 2 cm EPR, heated humidification and mask of his choice and comfort.  I prefer a Immunologist such as [employer] or [employer].

## 2022-07-11 NOTE — Addendum Note (Signed)
Addended by: Melvyn Novas on: 07/11/2022 09:00 AM   Modules accepted: Orders

## 2022-07-11 NOTE — Telephone Encounter (Signed)
-----   Message from Melvyn Novas, MD sent at 07/11/2022  9:00 AM EDT ----- Calculated pAHI (per hour): 35.6/h  REM pAHI: 60.5/h  NREM pAHI:27.1/h  IMPRESSION:  This HST confirms the presence of severe obstructive sleep apnea with a strong REM sleep accentuation.  There is no associated hypoxemia, heart rate showed a bradycardic trend.  RECOMMENDATION: Immediate therapy by positive airway pressure is recommended.  The patient will be furnished with a autotitration device between 7 and 18 cmH2O pressure 2 cm EPR, heated humidification and mask of his choice and comfort.  I prefer a Immunologist such as [employer] or [employer].

## 2022-07-11 NOTE — Telephone Encounter (Signed)
I called pt. I advised pt that Dr. Vickey Huger reviewed their sleep study results and found that pt has severe sleep apnea. Dr. Vickey Huger recommends that pt starts auto CPAP. I reviewed PAP compliance expectations with the pt. Pt is agreeable to starting a CPAP. I advised pt that an order will be sent to a DME, [facility], and [facility] will call the pt within about one week after they file with the pt's insurance. [facility] will show the pt how to use the machine, fit for masks, and troubleshoot the CPAP if needed. A follow up appt will need to be made for insurance purposes within 31-90 days from when pt received the machine. Patient advised to make sure to schedule this appt. A letter with all of this information in it will be sent to pt on mychart.  Pt verbalized understanding of results. Pt had no questions at this time but was encouraged to call back if questions arise. I have sent the order to [facility] and have received confirmation that they have received the order.

## 2022-07-11 NOTE — Procedures (Signed)
[facility] T. 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    Epworth sleepiness score:6 /24.   BMI: 31.4kg/m   Neck Circumference: 17"   FINDINGS:   Sleep Summary:   Total Recording Time (hours, min): Amounted to 7 hours and 53 minutes of which 6 hours and 54 minutes was a total calculated sleep time.           Percent REM (%):   25.3%                                     Respiratory Indices:   Calculated pAHI (per hour): 35.6/h                           REM pAHI:   60.5/h                                              NREM pAHI:    27.1/h                          Positional AHI: The patient slept mostly in left lateral position associated with an AHI of 3% followed by right lateral position with an AHI of 46.2% in supine sleep with an AHI of 39.2%.    Snoring volume was low; the mean volume was 40 dB and only measured for 1.7% of total sleep time.                                                Oxygen Saturation Statistics:   O2 Saturation Range (%): Between a nadir at 89 and a maximum of 99% , mean saturation was 93%                                      O2 Saturation (minutes) <89%: 0 minutes          Pulse Rate Statistics:   Pulse Mean (bpm): 54 bpm                Pulse Range:    Between 48 bpm and a maximum of 81 bpm.  Please note that these data cannot reflect a cardiac rhythm only the cardiac rate.             IMPRESSION:  This HST confirms the presence of severe obstructive sleep apnea with a strong REM sleep accentuation.  There is no associated hypoxemia, heart rate showed a bradycardic trend.   RECOMMENDATION: Immediate therapy by positive airway pressure is recommended.  The  patient will be furnished with a autotitration device between 7 and 18 cmH2O pressure 2 cm EPR, heated humidification and mask of his choice and comfort.  I prefer a Immunologist such  as [employer] or [employer].    INTERPRETING PHYSICIAN:   Melvyn Novas, MD   Medical Director of [facility] at [facility].

## 2022-07-18 ENCOUNTER — Telehealth: Payer: Self-pay | Admitting: Neurology

## 2022-07-18 NOTE — Telephone Encounter (Signed)
Pt was called to schedule initial CPAP f/u between 08-13-22 and 10-11-22 pt states he is on vacation and will call back to schedule.

## 2022-08-08 NOTE — Telephone Encounter (Signed)
Pt was scheduled for his initial CPAP on (08-29-22) Pt was informed to bring machine and power cord to the appointment.   DME and between dates are in pt's SnapShot.

## 2022-08-29 ENCOUNTER — Ambulatory Visit: Payer: 59 | Admitting: Neurology

## 2022-08-29 ENCOUNTER — Encounter: Payer: Self-pay | Admitting: Neurology

## 2022-08-29 VITALS — BP 148/92 | HR 63 | Ht 74.0 in | Wt 247.0 lb

## 2022-08-29 DIAGNOSIS — G4733 Obstructive sleep apnea (adult) (pediatric): Secondary | ICD-10-CM

## 2022-08-29 DIAGNOSIS — Z9989 Dependence on other enabling machines and devices: Secondary | ICD-10-CM | POA: Diagnosis not present

## 2022-08-29 NOTE — Progress Notes (Addendum)
SLEEP MEDICINE CLINIC    Provider:  Melvyn Novas, MD  Primary Care Physician:  Cleatis Polka., MD [address] [place] [address]     Referring Provider: Cleatis Polka., Md [address] [place],  [place] [address]          Chief Complaint according to patient   Patient presents with:               Presents today for "initial cpap" use. Set up with [facility]. States he has some good nights and some bad nights. Wakes up during the night due to the machine being on face, still trying to get use to it. Overall quality sleep has improved and when it does work well he feels good. He has tried switching masks to see if that would help. DME: [facility].    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  The patient was fitted with a nasal interface but states that he now has an irritation at the bridge of the nose.  He often feels that there is too much pressure but there are several nights where he also has slept pretty much through the night and felt more rested and refreshed in the morning.  The data of his download shows 100% compliance which I find even more interesting as he travel to [place] and sleeping in the meantime, his average usage is 7 hours 49 minutes.  Settings as above AHI residual 0.0/h with a 95th percentile pressure of 9.2 cm water.  Air leaks were minimal 95th percentile air leak is 1.6 L/min.  He is also noted some oral leakage which she has reduced by using a chinstrap.  He also noted an increase in dream  sleep activity.  Fatigue score today is 22 points and Epworth sleepiness score is endorsed at 2 out of 24 points.  I recommended to try and get GECKO skin to cover the nasal bridge of course and also open if he would like to switch to a nasal pillow or nasal cradle mask. 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 He falls asleep faster. Snoring is much less.    06-28-2022: Chief concern according to patient :  Pt referred for sleep consult. Sleep study done in 2017. At the time pt perferred to try dental device first. Here to discuss possibly trying CPAP.    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 Medical history of hypertension, PVCs, left ventricular hypertrophy, hyperlipidemia, history of gout, recurrent iritis and overweight state, who reports snoring and no excessive daytime somnolence but has Nocturia 3-4  times, not responsive to Flomax, irregular sleep habits secondary to shift work.  He has woken himself up with a sense snoring and from his own snoring. He was started on a dental device.  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 He tries to go to bed before midnight, sometimes his shift will last until 1 AM or later and he will go to bed later and has difficulty falling asleep at the time.wakeup time is around 7 AM.    The patient had the first sleep study in the year 2017  This study demonstrates overall mild obstructive sleep apnea,  moderate in REM sleep, near moderate during supine sleep with a  total AHI borderline elevated at 5.5/hour, REM AHI of 17.4/hour,  The supine AHI was 14.4 versus a non-supine AHI of  1.6. and O2 nadir of 88%.   Moderate PLMs (periodic limb movements of sleep) were noted  during the study without significant arousals; clinical  correlation is recommended.    Sleep relevant medical history: Nocturia/ myoclonic  jerk at seep onset, sensitive to jet lag, could not see a difference in sleep quality after caffeine and ETOH cessation.   Family medical /sleep history: no other family member on CPAP with OSA.  Social history:  Patient is working as an ED physician, has one son, 10 years- of age.  The patient currently works/ used to work in shifts( [employer]/ rotating,) Pets are present: 2 hamsters. Tobacco use; none.  ETOH use 3-4 week,  Caffeine intake in form of Coffee( 1 in AM , instant ) Soda( /) Tea ( /)  no energy drinks. Regular exercise in form of gym/ lifting weights, and 4 hours of cardio.         Sleep habits are as follows: The patient's dinner time is between 6.30 PM when not at work.  The patient goes to bed at 11 PM and continues to sleep for intervals of 2-4  hours, wakes for many bathroom breaks, the first time at 2 AM.   The preferred sleep position is laterally, he sleeps in a guest room. Hypervigilant.  Mind racing. Wife has PLMs RLS.   with the support of 1-2 pillows.  Dreams are reportedly frequent/vivid.  6  AM is the usual rise time, goes to the [employer], even if he wants to sleep longer.  The patient wakes up spontaneously.  He reports feeling refreshed or restored in AM, with symptoms such as dry mouth, morning headaches, and residual fatigue.  Naps are taken frequently before evening shift , lasting from 20 to 30 minutes , and wakes  refreshed.    Review of Systems: Out of a complete 14 system review, the patient complains of only the following symptoms, and all other reviewed systems are negative.:  Fatigue, sleepiness , snoring, fragmented sleep, Insomnia - light sleep, mind racing, worried.    How likely are you to doze in the following situations: 0 = not likely, 1 = slight chance, 2 = moderate chance, 3 = high chance   Sitting and Reading? Watching Television? Sitting inactive in a public place (theater or meeting)? As a passenger in a car for an hour without a break? Lying  down in the afternoon when circumstances permit? Sitting and talking to someone? Sitting quietly after lunch without alcohol? In a car, while stopped for a few minutes in traffic?   Total = pre CPAP 6/ 24 points   FSS endorsed pre CPAP  at 35/ 63 points.   He also noted an increase in dream sleep activity. Focus is better in daytime and during night shift.    Fatigue score today is 22 points and  Epworth sleepiness score is endorsed at 2 out of  24 points.  Social History   Socioeconomic History   Marital status: Married    Spouse name: Liselott   Number of children: 1   Years of education: MD   Highest education level: Not on file  Occupational History   Occupation: [employer]  Tobacco Use   Smoking status: Never   Smokeless tobacco: Never   Tobacco comments:    occ cigar  Substance and Sexual Activity   Alcohol use: Yes    Alcohol/week: 6.0 standard drinks of alcohol    Types: 6 drink(s) per week   Drug use: No   Sexual activity: Not on file  Other Topics Concern   Not on file  Social History Narrative   Live at home with wife and child   Drinks about 1.5 caffeine drinks a day    R handed   Social Determinants of Health   Financial Resource Strain: Not on file  Food Insecurity: Not on file  Transportation Needs: Not on file  Physical Activity: Not on file  Stress: Not on file  Social Connections: Not on file    Family History  Problem Relation Age of Onset   Hypertension Father    Hypertension Mother    CAD Neg Hx     Past Medical History:  Diagnosis Date   Hyperlipidemia    Hypertension    Palpitations    PVCs    No past surgical history on file.   Current Outpatient Medications on File Prior to Visit  Medication Sig Dispense Refill   amLODipine (NORVASC) 5 MG tablet Take 7.5 mg by mouth daily.     docusate sodium (COLACE) 100 MG capsule Take 100 mg by mouth daily as needed for mild constipation.     Ibuprofen 200 MG CAPS Take 400 mg by mouth  as needed for pain.     olmesartan (BENICAR) 20 MG tablet Take 20 mg by mouth daily.     rosuvastatin (CRESTOR) 10 MG tablet   10   tamsulosin (FLOMAX) 0.4 MG CAPS capsule Take 0.4 mg by mouth at bedtime.     No current facility-administered medications on file prior to visit.    Allergies  Allergen Reactions   Promethazine     Physical exam:  Today's Vitals   08/29/22 1010  BP: (!) 148/92  Pulse: 63  Weight: 247 lb (112 kg)  Height: 6\' 2"  (1.88 m)   Body mass index is 31.71 kg/m.   Wt Readings from Last 3 Encounters:  08/29/22 247 lb (112 kg)  06/28/22 245 lb 8 oz (111.4 kg)  09/14/16 236 lb (107 kg)     Ht Readings from Last 3 Encounters:  08/29/22 6\' 2"  (1.88 m)  06/28/22 6\' 2"  (1.88 m)  09/14/16 6\' 2"  (1.88 m)      General: The patient is awake, alert and appears not in acute distress. The patient is well groomed. Head: Normocephalic, atraumatic.  Neck is supple.  Mallampati 2,  neck circumference:16.75 inches . Nasal airflow patent.   Retrognathia is seen.  Dental status: intact  Cardiovascular:  Regular rate and cardiac rhythm by pulse,  without distended neck veins. Respiratory: Lungs are clear to auscultation.  Skin:  Without evidence of ankle edema, or rash. Trunk: The patient's posture is erect.   Neurologic exam : The patient is awake and alert, oriented to place and time.   Memory subjective described as intact.  Attention span & concentration ability appears normal.  Speech is fluent,  without  dysarthria, dysphonia or aphasia.  Mood and affect are appropriate.   Cranial nerves: no loss of smell or taste reported  Pupils are equal and briskly reactive to light. Funduscopic exam deferred. .  Extraocular movements in vertical and horizontal planes were intact and without nystagmus. No Diplopia. Visual fields by finger perimetry are intact. Hearing was intact to soft voice and finger rubbing.    Facial sensation intact to fine touch.  Facial  motor strength is symmetric and tongue and uvula move midline.  Neck ROM : rotation, tilt and flexion extension were normal for age and shoulder shrug was symmetrical.    Motor exam:  Symmetric bulk, tone and ROM.   Normal tone without cog wheeling, symmetric grip strength .   Sensory:  Fine touch, pinprick and vibration were tested  and  normal.  Proprioception tested in the upper extremities was normal.   Coordination: Rapid alternating movements in the fingers/hands were of normal speed.  The Finger-to-nose maneuver was intact without evidence of ataxia, dysmetria or tremor.   Gait and station: Patient could rise unassisted from a seated position, walked without assistive device.  Stance is of normal width/ base and the patient turned with 3 steps.  Toe and heel walk were deferred.  Deep tendon reflexes: in the  upper and lower extremities are symmetric and intact.  Babinski response was deferred.       After spending a total time of 20 minutes face to face and additional time for physical and neurologic examination, review of laboratory studies,  personal review of imaging studies, reports and results of other testing and review of referral information / records as far as provided in visit, I have established the following assessments:  1) There has been a weight gain of 12 pounds that seems to correlate to more snoring. There is frequent nocturia, snoring, morning headaches. All continued while using a dental device for  Dr Cleotis Nipper, DDS.  Now diagnosed with severe OSA, by HST started auto CPAP.  CPAP 100% compliance.  He also noted an increase in dream sleep activity. Focus is better in daytime and during night shift.   Fatigue score today is 22 points and Epworth sleepiness score is endorsed at 2 out of 24 points.  I recommended to try and get GECKO skin to cover the nasal bridge of course and also open if he would like to switch to a nasal pillow or nasal cradle mask. He is  a shift worker- he tries to be in bed by 10.30 Pm whenever possible. He falls asleep faster. Snoring is much less.     My Plan is to proceed with: CPAP to be continued, added chin strap- increased EPR to 3 cm .  will write for gecko skin patch.   I would like to thank Cleatis Polka., MD and Cleatis Polka., Md [address] [place],  [place] [address] for allowing me to meet with and to take care of this pleasant patient.   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   I plan to follow up either personally or through our NP within 2-3 months     Electronically signed by: Melvyn Novas, MD 08/29/2022 10:38 AM  [facility] Board certified by The [employer] of Sleep Medicine and Diplomate of the [employer] of Sleep Medicine. Board certified In Neurology  through the ABPN, Fellow of the [employer] of Neurology. Medical Director of [facility].

## 2022-08-29 NOTE — Progress Notes (Signed)
Faxed the following order to [facility], Fax: [phone]: "Gecko skin patch to help with nasal bridge irritation. Increase EPR to 3 cm water." Received fax confirmation.

## 2022-08-29 NOTE — Patient Instructions (Addendum)
KEEPING IT CLEAN: CPAP HYGIENE PROPER UPKEEP OF YOUR CPAP MACHINE CAN HELP ENSURE THE DEVICE FUNCTIONS PROPERLY CPAP CLEANING INSTRUCTIONS Along with proper CPAP cleaning it is recommended that you replace your mask, tubing and filters once very 3 months and more frequently if you are sick.   DAILY CLEANING Do not use moisturizing soaps, bleach, scented oils, chlorine, or alcohol-based solutions to clean your supplies. These solutions may cause irritation to your skin and lungs and may reduce the life of your products. Dawn [employer] or Comparable works best for daily cleaning.  **If you've been sick, it's smart to wash your mask, tubing, humidifier and filter daily until your cold, flu or virus symptoms are gone. That can help reduce the amount of time you spend under the weather.  Before using your mask -wash your face daily with soap and water to remove excess facial oils. Wipe down your mask (including areas that come in contact with your skin) using a damp towel with soap and warm water. This will remove any oils, dead skin cells, and sweat on the mask that can affect the quality of the seal. Gently rinse with a clean towel and let the mask air-dry out of direct sunlight. You can also use unscented baby wipes or pre-moistened towels designed specifically for cleaning CPAP masks, which are available on-line. DO NOT USE CLOROX OR DISINFECTING WIPES. If your unit has a humidifier, empty any leftover water instead of letting in sit in the unit all day. Refill the humidifier with clean, distilled water right before bedtime for optimal use WEEKLY (OR MORE FREQUENT) CLEANING Your mask and tubing need a full bath at least once a week to keep it free of dust, bacteria, and germs. (During COVID-19 or any other flu/virus we recommend more frequent cleaning) Clean the CPAP tubing, nasal mask, and headgear in a bathroom sink filled with warm water and a few drops of ammonia-free, mild dish detergent. Avoid  using stronger cleaning products, as they may damage the mask or leave harmful residue. Swirl all parts around for about five minutes, rinse well and let air dry during the day. Hang the tubing over the shower rod, on a towel rack or in the laundry room to ensure all the water drips out. The mask and headgear can be air-dried on a towel or hung on a hook or hanger. You should also wipe down your CPAP machine with a damp cloth. Ensure the unit is unplugged. The towel shouldn't be too damp or wet, as water could get into the machine. Clean the filter by removing it and rinsing it in warm tap water. Run it under the water and squeeze to make sure there is no dust. Then blot down the filter with a towel. Do not wash your machine's white filter, if one is present--those are disposable and should be replaced every two weeks. If you are recovering from being sick, we recommend changing the filter sooner. If your CPAP has a humidifier, that also needs to be cleaned weekly. Empty any remaining water and then wash the water chamber in the sink with warm soapy water. Rinse well and drain out as much of the water as possible. Let the chamber air-dry before placing it back into the CPAP unit. Every other week you should disinfect the humidifier. Do that by soaking it in a solution of one-part vinegar to five parts water for 30 minutes, thoroughly rinsing and then placing in your dishwasher's top rack for washing. And keep  it clean by using only distilled water to prevent mineral deposits that can build up and cause damage to your machine. IMPORTANT TIPS Make caring for your CPAP equipment part of your morning routine. Keep machine and accessories out of direct sunlight to avoid damaging them. Never use bleach to clean accessories. Place machine on a level surface and away from curtains that may interfere with the air intake. Keep track of when you should order replacement parts for your mask and accessories so that  you always get the most out of your CPAP. You can also sign up for Auto Supply by contacting our DME department at [email or web address] **The following are examples of soap that may be used: [employer], [place] soap (plain).  With a little upkeep, your CPAP can continue to help you breathe better for a long time. Just a few minutes a day can help keep your CPAP running efficiently for years to come.  If you have a CPAP, but are struggling with compliance, please contact your DME

## 2023-05-14 ENCOUNTER — Emergency Department (HOSPITAL_COMMUNITY)
Admission: EM | Admit: 2023-05-14 | Discharge: 2023-05-14 | Disposition: A | Discharge status: Home / Self Care | Payer: 59 | Attending: Emergency Medicine | Admitting: Emergency Medicine

## 2023-05-14 ENCOUNTER — Encounter (HOSPITAL_COMMUNITY): Payer: Self-pay | Admitting: *Deleted

## 2023-05-14 ENCOUNTER — Emergency Department (HOSPITAL_COMMUNITY): Payer: 59

## 2023-05-14 ENCOUNTER — Other Ambulatory Visit: Payer: Self-pay

## 2023-05-14 DIAGNOSIS — R42 Dizziness and giddiness: Secondary | ICD-10-CM | POA: Diagnosis present

## 2023-05-14 LAB — I-STAT CHEM 8, ED
BUN: 15 mg/dL (ref 6–20)
Calcium, Ion: 1.12 mmol/L — ABNORMAL LOW (ref 1.15–1.40)
Chloride: 103 mmol/L (ref 98–111)
Creatinine, Ser: 1.1 mg/dL (ref 0.61–1.24)
Glucose, Bld: 122 mg/dL — ABNORMAL HIGH (ref 70–99)
HCT: 41 % (ref 39.0–52.0)
Hemoglobin: 13.9 g/dL (ref 13.0–17.0)
Potassium: 3.8 mmol/L (ref 3.5–5.1)
Sodium: 140 mmol/L (ref 135–145)
TCO2: 29 mmol/L (ref 22–32)

## 2023-05-14 LAB — CBC WITH DIFFERENTIAL/PLATELET
Abs Immature Granulocytes: 0.01 10*3/uL (ref 0.00–0.07)
Basophils Absolute: 0 10*3/uL (ref 0.0–0.1)
Basophils Relative: 1 %
Eosinophils Absolute: 0 10*3/uL (ref 0.0–0.5)
Eosinophils Relative: 0 %
HCT: 41.4 % (ref 39.0–52.0)
Hemoglobin: 14 g/dL (ref 13.0–17.0)
Immature Granulocytes: 0 %
Lymphocytes Relative: 42 %
Lymphs Abs: 1.8 10*3/uL (ref 0.7–4.0)
MCH: 31 pg (ref 26.0–34.0)
MCHC: 33.8 g/dL (ref 30.0–36.0)
MCV: 91.8 fL (ref 80.0–100.0)
Monocytes Absolute: 0.3 10*3/uL (ref 0.1–1.0)
Monocytes Relative: 8 %
Neutro Abs: 2.1 10*3/uL (ref 1.7–7.7)
Neutrophils Relative %: 49 %
Platelets: 179 10*3/uL (ref 150–400)
RBC: 4.51 MIL/uL (ref 4.22–5.81)
RDW: 12.6 % (ref 11.5–15.5)
WBC: 4.4 10*3/uL (ref 4.0–10.5)
nRBC: 0 % (ref 0.0–0.2)

## 2023-05-14 LAB — COMPREHENSIVE METABOLIC PANEL
ALT: 25 U/L (ref 0–44)
AST: 35 U/L (ref 15–41)
Albumin: 4.3 g/dL (ref 3.5–5.0)
Alkaline Phosphatase: 66 U/L (ref 38–126)
Anion gap: 10 (ref 5–15)
BUN: 13 mg/dL (ref 6–20)
CO2: 24 mmol/L (ref 22–32)
Calcium: 9.2 mg/dL (ref 8.9–10.3)
Chloride: 103 mmol/L (ref 98–111)
Creatinine, Ser: 1.06 mg/dL (ref 0.61–1.24)
GFR, Estimated: >60 mL/min (ref >60)
Glucose, Bld: 119 mg/dL — ABNORMAL HIGH (ref 70–99)
Potassium: 3.7 mmol/L (ref 3.5–5.1)
Sodium: 137 mmol/L (ref 135–145)
Total Bilirubin: 1.5 mg/dL — ABNORMAL HIGH (ref 0.3–1.2)
Total Protein: 7.2 g/dL (ref 6.5–8.1)

## 2023-05-14 LAB — MAGNESIUM: Magnesium: 2 mg/dL (ref 1.7–2.4)

## 2023-05-14 MED ORDER — IOHEXOL 350 MG/ML SOLN
75.0000 mL | Freq: Once | INTRAVENOUS | Status: AC | PRN
  Administered 2023-05-14: 75 mL via INTRAVENOUS

## 2023-05-14 MED ORDER — MECLIZINE HCL 25 MG PO TABS: 25.0000 mg | ORAL_TABLET | Freq: Three times a day (TID) | ORAL | 0 refills | Status: DC | PRN

## 2023-05-14 MED ORDER — SODIUM CHLORIDE 0.9 % IV BOLUS
1000.0000 mL | Freq: Once | INTRAVENOUS | Status: AC
  Administered 2023-05-14: 1000 mL via INTRAVENOUS

## 2023-05-14 NOTE — ED Notes (Signed)
EDP at BS 

## 2023-05-14 NOTE — ED Notes (Signed)
To MRI, no changes.

## 2023-05-14 NOTE — Discharge Instructions (Addendum)
Return for worsening dizziness, headache, neck pain, inability to walk.  Follow up with your doctor in the office.

## 2023-05-14 NOTE — ED Notes (Signed)
CT complete to MRI

## 2023-05-14 NOTE — ED Provider Notes (Signed)
[place] EMERGENCY DEPARTMENT AT [facility] Provider Note   CSN: [id number] Arrival date & time: 05/14/23  1513     History  Chief Complaint  Patient presents with   Dizziness    Eddie [name] is a 57 y.o. male.  57 yo M with a chief complaints of sudden onset dizziness.  This started about 1 AM this morning.  He was in bed and noticed that he was very dizzy.  Had some trouble ambulating.  He took a hydroxyzine and had some improvement.  He was in [place] playing golf.  He then decided to come back to [place] where he lives to be seen.  Has had some posterior neck stiffness.  Symptoms persist especially when he flexes or fully extends his neck.  He denies one-sided numbness or weakness denies difficulty speech or swallowing.  Denies head or neck injury.  Denies thoracic or lower back pain.  He went to a neurologist's house and had a exam performed.  There is some concern for vertebral artery dissection and he was sent here for evaluation.   Dizziness      Home Medications Prior to Admission medications   Medication Sig Start Date End Date Taking? Authorizing Provider  meclizine (ANTIVERT) 25 MG tablet Take 1 tablet (25 mg total) by mouth 3 (three) times daily as needed for dizziness. 05/14/23  Yes Melene Plan, DO  amLODipine (NORVASC) 5 MG tablet Take 7.5 mg by mouth daily. 06/02/22   [provider]  docusate sodium (COLACE) 100 MG capsule Take 100 mg by mouth daily as needed for mild constipation.    [provider]  Ibuprofen 200 MG CAPS Take 400 mg by mouth as needed for pain.    [provider]  olmesartan (BENICAR) 20 MG tablet Take 20 mg by mouth daily. 06/11/22   [provider]  rosuvastatin (CRESTOR) 10 MG tablet  09/04/16   [provider]  tamsulosin (FLOMAX) 0.4 MG CAPS capsule Take 0.4 mg by mouth at bedtime. 08/03/22   [provider]      Allergies    Promethazine    Review of Systems    Review of Systems  Neurological:  Positive for dizziness.    Physical Exam Updated Vital Signs BP (!) 154/95   Pulse 68   Temp 98.1 F (36.7 C) (Oral)   Resp 17   SpO2 100%  Physical Exam Vitals and nursing note reviewed.  Constitutional:      Appearance: He is well-developed.  HENT:     Head: Normocephalic and atraumatic.  Eyes:     Pupils: Pupils are equal, round, and reactive to light.  Neck:     Vascular: No JVD.  Cardiovascular:     Rate and Rhythm: Normal rate and regular rhythm.     Heart sounds: No murmur heard.    No friction rub. No gallop.  Pulmonary:     Effort: No respiratory distress.     Breath sounds: No wheezing.  Abdominal:     General: There is no distension.     Tenderness: There is no abdominal tenderness. There is no guarding or rebound.  Musculoskeletal:        General: Normal range of motion.     Cervical back: Normal range of motion and neck supple.  Skin:    Coloration: Skin is not pale.     Findings: No rash.  Neurological:     Mental Status: He is alert and oriented to  person, place, and time.     Comments: No obvious weakness  Psychiatric:        Behavior: Behavior normal.     ED Results / Procedures / Treatments   Labs (all labs ordered are listed, but only abnormal results are displayed) Labs Reviewed  COMPREHENSIVE METABOLIC PANEL - Abnormal; Notable for the following components:      Result Value   Glucose, Bld 119 (*)    Total Bilirubin 1.5 (*)    All other components within normal limits  I-STAT CHEM 8, ED - Abnormal; Notable for the following components:   Glucose, Bld 122 (*)    Calcium, Ion 1.12 (*)    All other components within normal limits  CBC WITH DIFFERENTIAL/PLATELET  MAGNESIUM    EKG None  Radiology MR BRAIN WO CONTRAST  Result Date: 05/14/2023 CLINICAL DATA:  Neuro deficit, acute, stroke suspected. EXAM: MRI HEAD WITHOUT CONTRAST TECHNIQUE: Multiplanar, multiecho pulse sequences of the brain and  surrounding structures were obtained without intravenous contrast. COMPARISON:  Head and neck CTA 05/14/2023 FINDINGS: Brain: There is no evidence of an acute infarct, intracranial hemorrhage, mass, midline shift, or extra-axial fluid collection. The ventricles and sulci are normal. A cavum velum interpositum is incidentally noted, a normal variant. The brain is normal in signal. Vascular: Major intracranial vascular flow voids are preserved. Skull and upper cervical spine: Unremarkable bone marrow signal. Sinuses/Orbits: Unremarkable orbits. Clear paranasal sinuses. No significant mastoid fluid. Other: None. IMPRESSION: Negative brain MRI. Electronically Signed   By: Sebastian Ache M.D.   On: 05/14/2023 16:43   CT ANGIO HEAD NECK W WO CM  Result Date: 05/14/2023 CLINICAL DATA:  Neuro deficit, acute, stroke suspected.  Dizziness. EXAM: CT HEAD WITHOUT CONTRAST CT ANGIOGRAPHY HEAD AND NECK WITH AND WITHOUT CONTRAST TECHNIQUE: CT of the head without contrast, followed by Multidetector CT imaging of the head and neck was performed using the standard protocol during bolus administration of intravenous contrast. Multiplanar CT image reconstructions and MIPs were obtained to evaluate the vascular anatomy. Carotid stenosis measurements (when applicable) are obtained utilizing NASCET criteria, using the distal internal carotid diameter as the denominator. RADIATION DOSE REDUCTION: This exam was performed according to the departmental dose-optimization program which includes automated exposure control, adjustment of the mA and/or kV according to patient size and/or use of iterative reconstruction technique. CONTRAST:  75mL OMNIPAQUE IOHEXOL 350 MG/ML SOLN COMPARISON:  None Available. FINDINGS: CT HEAD FINDINGS Brain: No acute intracranial hemorrhage. Gray-white differentiation is preserved. No hydrocephalus or extra-axial collection. No mass effect or midline shift. Vascular: No hyperdense vessel or unexpected  calcification. Skull: No calvarial fracture or suspicious bone lesion. Skull base is unremarkable. Sinuses/Orbits: Unremarkable. Other: None. Review of the MIP images confirms the above findings CTA NECK FINDINGS Aortic arch: Two-vessel arch configuration with common origin of the right brachiocephalic and left common carotid arteries. Arch vessel origins are patent. Right carotid system: No evidence of dissection, stenosis (50% or greater), or occlusion. Mild calcified plaque at the right carotid bulb. Tortuous distal right cervical ICA. Left carotid system: No evidence of dissection, stenosis (50% or greater), or occlusion. Mild calcified plaque at the left carotid bulb. Tortuous proximal left cervical ICA. Vertebral arteries: Codominant. No evidence of dissection, stenosis (50% or greater), or occlusion. Skeleton: Mild cervical spondylosis without high-grade spinal canal stenosis. Other neck: Unremarkable. Upper chest: Unremarkable. Review of the MIP images confirms the above findings CTA HEAD FINDINGS Anterior circulation: Intracranial ICAs are patent without stenosis or aneurysm. The proximal  ACAs and MCAs are patent without stenosis or aneurysm. Distal branches are symmetric. Posterior circulation: Normal basilar artery. The SCAs, AICAs and PICAs are patent proximally. The PCAs are patent proximally without stenosis or aneurysm. Distal branches are symmetric. Venous sinuses: As permitted by contrast timing, patent. 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 Review of the MIP images confirms the above findings IMPRESSION: 1. No acute intracranial abnormality. 2. No large vessel occlusion, significant stenosis, or evidence of dissection in the head or neck vessels. Electronically Signed   By: Orvan Falconer M.D.   On: 05/14/2023 16:12    Procedures Procedures    Medications Ordered in ED Medications  sodium chloride 0.9 % bolus 1,000 mL (0 mLs  Intravenous Stopped 05/14/23 1709)  iohexol (OMNIPAQUE) 350 MG/ML injection 75 mL (75 mLs Intravenous Contrast Given 05/14/23 1556)    ED Course/ Medical Decision Making/ A&P                             Medical Decision Making Amount and/or Complexity of Data Reviewed Labs: ordered. Radiology: ordered.  Risk Prescription drug management.   57 yo M with a chief complaints of sudden onset dizziness.  Started about 1 AM.  Since then has had ongoing dizziness especially with flexion or extension at his neck.  He has been playing golf for the past couple days in [place].  There is some concern for vertebral artery dissection if he was sent here for evaluation.  I did discuss this case with the neurologist that he saw, recommending a CT angiogram of the head and neck and an MRI of the brain.  Offered headache cocktail which declined.  Bolus of IV fluids blood work reassess.  CT angiogram of the head and neck are negative for vertebral artery dissection or other obvious acute intracranial pathology.  Patient's MRI of the brain is read negative for stroke.  Lab work is resulted and is mostly reassuring.  He has mild elevation of his total bilirubin which could be secondary to dehydration.  Discussed results with the patient.  He is comfortable going home.  Has a neurologist that he already sees and he will contact.  Will follow-up with his family doctor as well.  Short course of meclizine.  5:25 PM:  I have discussed the diagnosis/risks/treatment options with the patient.  Evaluation and diagnostic testing in the emergency department does not suggest an emergent condition requiring admission or immediate intervention beyond what has been performed at this time.  They will follow up with Neuro, PCP. We also discussed returning to the ED immediately if new or worsening sx occur. We discussed the sx which are most concerning (e.g., sudden worsening pain, fever, inability to tolerate by mouth) that  necessitate immediate return. Medications administered to the patient during their visit and any new prescriptions provided to the patient are listed below.  Medications given during this visit Medications  sodium chloride 0.9 % bolus 1,000 mL (0 mLs Intravenous Stopped 05/14/23 1709)  iohexol (OMNIPAQUE) 350 MG/ML injection 75 mL (75 mLs Intravenous Contrast Given 05/14/23 1556)     The patient appears reasonably screen and/or stabilized for discharge and I doubt any other medical condition or other [facility] requiring further screening, evaluation, or treatment in the ED at this time prior to discharge.          Final Clinical Impression(s) / ED Diagnoses Final diagnoses:  Dizziness  Rx / DC Orders ED Discharge Orders          Ordered    meclizine (ANTIVERT) 25 MG tablet  3 times daily PRN        05/14/23 1704              Melene Plan, DO 05/14/23 1725

## 2023-05-14 NOTE — ED Notes (Signed)
Back from MRI, no changes, denies current sx. "Sx only with walking and looking down".

## 2023-05-14 NOTE — ED Notes (Signed)
To CT, then to MRI

## 2023-09-03 ENCOUNTER — Ambulatory Visit: Payer: 59 | Admitting: Neurology

## 2023-09-03 ENCOUNTER — Encounter: Payer: Self-pay | Admitting: Neurology

## 2023-09-03 VITALS — BP 148/88 | HR 80 | Ht 74.0 in | Wt 249.0 lb

## 2023-09-03 DIAGNOSIS — R351 Nocturia: Secondary | ICD-10-CM | POA: Diagnosis not present

## 2023-09-03 DIAGNOSIS — G4733 Obstructive sleep apnea (adult) (pediatric): Secondary | ICD-10-CM

## 2023-09-03 NOTE — Progress Notes (Signed)
Provider:  Melvyn Novas, MD  Primary Care Physician:  Eddie [name]., MD [address] [place] [address]     Referring Provider: Cleatis [name]., Md [address] [place],  [place] [address]          Chief Complaint according to patient   Patient presents with:     Sleep Patient (new problem , insomnia )     Pt is here for yearly follow up on OSA. Pt states he is using his CPAP 6 HOURS PER NIGHT. Pt states he is having more trouble falling and staying asleep. Nocturia : 5 times !!!      HISTORY OF PRESENT ILLNESS:  Eddie [name] is a 57 y.o. male patient who is here for revisit 09/03/2023 for OSA- CPAP compliance.  Chief concern according to patient : " I now have trouble  again with nocturia, and my bladder is full, I need to void almost every hour. I now take naps'>    "Before this started as recently as 3 weeks ago , I was sleeping in intervals of 3 hours.  Flomax was stopped and re-started.  Amlodipine caused edema- will now reduce by 50%  BP is well controlled, but higher in the MD office.  We changed diet, fluid intake, looked at my supplements, and I have still frequent nocturia"      The compliance with CPAP is excellent at 100% for days and hours with an average use at time of 7 hours 49 minutes.  The minimum pressure is 7 maximum pressure 18 cm water with 3 cm EPR the residual AHI is of record low at 0.1/h this is more than excellent.  No central apneas arising, 95th percentile pressure is 8 cm water there are minimal leaks 95th percentile is 0.5 L/min.  I doubt that sleep apnea has anything to do with the current resurgence in nocturia.   I would look at salt reduction, reduction in amlodipine, alcohol intake and caffeine.  Carbonated drinks, water may still contribute.      HISTORY OF PRESENT ILLNESS: 08-29-2022  Presents today for "initial cpap" use. Set up with [facility]. States he has some good nights and some bad nights. Wakes up  during the night due to the machine being on face, still trying to get use to it. Overall quality sleep has improved and when it does work well he feels good. He has tried switching masks to see if that would help. DME: [facility].    Eddie [name] is a 57 y.o.  African American male patient seen here as RV on 08/29/2022 : Eddie [name] underwent a home sleep test which confirmed the diagnosis of severe sleep apnea his AHI was 35.6 and his REM AHI 60.5/h with this kind of REM dependent sleep apnea only positive airway pressure was recommended and I ordered an auto titration CPAP device between 7 and 18 cmH2O pressure was 2 cm expiratory pressure relief heated humidification and a mask of his choice.  The patient was fitted with a nasal interface but states that he now has an irritation at the bridge of the nose.  He often feels that there is too much pressure but there are several nights where he also has slept pretty much through the night and felt more rested and refreshed in the morning.  The data of his download shows 100% compliance which I find even more interesting as he travel to  [place] and sleeping in the meantime, his average usage is 7 hours 49 minutes.  Settings as above AHI residual 0.0/h with a 95th percentile pressure of 9.2 cm water.  Air leaks were minimal 95th percentile air leak is 1.6 L/min.  He is also noted some oral leakage which she has reduced by using a chinstrap.  He also noted an increase in dream sleep activity.  Fatigue score today is 22 points and Epworth sleepiness score is endorsed at 2 out of 24 points.    Review of Systems: Out of a complete 14 system review, the patient complains of only the following symptoms, and all other reviewed systems are negative.:  Fatigue, sleepiness , snoring,  fragmented sleep,  Unable to reinitiate sleep- Insomnia, RLS, Nocturia NEW 4-5 times !!!   How likely are you to doze in the following situations: 0 = not likely, 1 = slight chance, 2  = moderate chance, 3 = high chance   Sitting and Reading? Watching Television? Sitting inactive in a public place (theater or meeting)? As a passenger in a car for an hour without a break? Lying down in the afternoon when circumstances permit? Sitting and talking to someone? Sitting quietly after lunch without alcohol? In a car, while stopped for a few minutes in traffic?   Total = 6/ 24 points - with naps.   FSS endorsed at 25/ 63 points.   Social History   Socioeconomic History   Marital status: Married    Spouse name: Eddie [name]   Number of children: 1   Years of education: MD   Highest education level: Doctorate   Occupational History   Occupation: [name]  Tobacco Use   Smoking status: Never   Smokeless tobacco: Never   Tobacco comments:    occ cigar  Substance and Sexual Activity   Alcohol use: Yes    Alcohol/week: 6.0 standard drinks of alcohol    Types: 6 drink(s) per week   Drug use: No   Sexual activity: Not on file  Other Topics Concern   Not on file  Social History Narrative   Live at home with wife and child   Drinks about 1.5 caffeine drinks a day    R handed   Social Determinants of Health   Financial Resource Strain: Not on file  Food Insecurity: Not on file  Transportation Needs: Not on file  Physical Activity: Not on file  Stress: Not on file  Social Connections: Not on file    Family History  Problem Relation Age of Onset   Hypertension Father    Hypertension Mother    CAD Neg Hx     Past Medical History:  Diagnosis Date   Hyperlipidemia    Hypertension    Palpitations    PVCs    History reviewed. No pertinent surgical history.   Current Outpatient Medications on File Prior to Visit  Medication Sig Dispense Refill   amLODipine (NORVASC) 5 MG tablet Take 7.5 mg by mouth daily.     docusate sodium (COLACE) 100 MG capsule Take 100 mg by mouth daily as needed for mild constipation.     Ibuprofen 200 MG CAPS Take 400 mg by mouth  as needed for pain.     olmesartan (BENICAR) 20 MG tablet Take 20 mg by mouth daily.     rosuvastatin (CRESTOR) 10 MG tablet   10   tamsulosin (FLOMAX) 0.4 MG CAPS capsule Take 0.4 mg by mouth at bedtime.     No  current facility-administered medications on file prior to visit.    Allergies  Allergen Reactions   Promethazine      DIAGNOSTIC DATA (LABS, IMAGING, TESTING) - I reviewed patient records, labs, notes, testing and imaging myself where available.  Lab Results  Component Value Date   WBC 4.4 05/14/2023   HGB 13.9 05/14/2023   HCT 41.0 05/14/2023   MCV 91.8 05/14/2023   PLT 179 05/14/2023      Component Value Date/Time   NA 140 05/14/2023 1539   K 3.8 05/14/2023 1539   CL 103 05/14/2023 1539   CO2 24 05/14/2023 1530   GLUCOSE 122 (H) 05/14/2023 1539   BUN 15 05/14/2023 1539   CREATININE 1.10 05/14/2023 1539   CALCIUM 9.2 05/14/2023 1530   PROT 7.2 05/14/2023 1530   ALBUMIN 4.3 05/14/2023 1530   AST 35 05/14/2023 1530   ALT 25 05/14/2023 1530   ALKPHOS 66 05/14/2023 1530   BILITOT 1.5 (H) 05/14/2023 1530   GFRNONAA >60 05/14/2023 1530   No results found for: "CHOL", "HDL", "LDLCALC", "LDLDIRECT", "TRIG", "CHOLHDL" No results found for: "HGBA1C" No results found for: "VITAMINB12" No results found for: "TSH"  PHYSICAL EXAM:  Today's Vitals   09/03/23 1026  BP: (!) 148/88  Pulse: 80  Weight: 249 lb (112.9 kg)  Height: 6\' 2"  (1.88 m)   Body mass index is 31.97 kg/m.   Wt Readings from Last 3 Encounters:  09/03/23 249 lb (112.9 kg)  08/29/22 247 lb (112 kg)  06/28/22 245 lb 8 oz (111.4 kg)     Ht Readings from Last 3 Encounters:  09/03/23 6\' 2"  (1.88 m)  08/29/22 6\' 2"  (1.88 m)  06/28/22 6\' 2"  (1.88 m)      General:  The patient is awake, alert and appears not in acute distress. The patient is well groomed. Head: Normocephalic, atraumatic.  Neck is supple.  Mallampati 2,  neck circumference:16.75 inches . Nasal airflow patent.   Retrognathia  is seen.  Dental status: intact  Cardiovascular:  Regular rate and cardiac rhythm by pulse,  without distended neck veins. Respiratory: Lungs are clear to auscultation.  Skin:  Without evidence of ankle edema, or rash. Trunk: The patient's posture is erect.   Neurologic exam : The patient is awake and alert, oriented to place and time.   Memory subjective described as intact.  Attention span & concentration ability appears normal.  Speech is fluent,  without  dysarthria, dysphonia or aphasia.  Mood and affect are appropriate.   Cranial nerves: no loss of smell or taste reported  Pupils are equal and briskly reactive to light. Funduscopic exam deferred. .  Extraocular movements in vertical and horizontal planes were intact and without nystagmus. No Diplopia. Visual fields by finger perimetry are intact. Hearing was intact to soft voice and finger rubbing.    Facial sensation intact to fine touch.  Facial motor strength is symmetric and tongue and uvula move midline.  Neck ROM : rotation, tilt and flexion extension were normal for age and shoulder shrug was symmetrical.    Motor exam:  Symmetric bulk, tone and ROM.   Normal tone without cog wheeling, symmetric grip strength .  ASSESSMENT AND PLAN 57 y.o. year old male  here with: CPAP compliance and newly worsening of Nocturia.     1) Nocturia and ankle edema , while on Norvasc.  Just reduced  his dose by 50%. Lets see if that reduces the nocturnal urination.  The bladder was full, no cramping,  no burning- no smell- urine was clear, no proteinuria.   Consider salt intake as a cause for nocturia. Salt diuresis is a frequent actor.   2) excellent compliance on auto-titration CPAP device of 100% and excellent reduction to an  AHI 0.1/h.   PS : ED /recent vertigo evaluation in ED showed normal vascular tee, no stroke, no cerebellar injury or atrophy.    I plan to follow up either personally or through our NP within 12 months.   I  would like to thank Eddie [name]., MD for allowing me to meet with and to take care of this pleasant patient.    After spending a total time of  23  minutes face to face and additional time for physical and neurologic examination, review of laboratory studies,  personal review of imaging studies, reports and results of other testing and review of referral information / records as far as provided in visit,   Electronically signed by: Eddie Novas, MD 09/03/2023 10:57 AM  [facility] Board certified by The [employer] of Sleep Medicine and Diplomate of the [employer] of [employer]. Board certified In Neurology through the ABPN, Fellow of the [employer] of Neu[employer].

## 2023-09-03 NOTE — Patient Instructions (Signed)

## 2023-09-07 ENCOUNTER — Ambulatory Visit: Payer: Self-pay | Admitting: Clinical

## 2024-01-04 DIAGNOSIS — G4733 Obstructive sleep apnea (adult) (pediatric): Secondary | ICD-10-CM | POA: Diagnosis not present

## 2024-01-09 DIAGNOSIS — I10 Essential (primary) hypertension: Secondary | ICD-10-CM | POA: Diagnosis not present

## 2024-04-09 DIAGNOSIS — M109 Gout, unspecified: Secondary | ICD-10-CM | POA: Diagnosis not present

## 2024-04-09 DIAGNOSIS — E785 Hyperlipidemia, unspecified: Secondary | ICD-10-CM | POA: Diagnosis not present

## 2024-04-09 DIAGNOSIS — Z125 Encounter for screening for malignant neoplasm of prostate: Secondary | ICD-10-CM | POA: Diagnosis not present

## 2024-04-18 DIAGNOSIS — I119 Hypertensive heart disease without heart failure: Secondary | ICD-10-CM | POA: Diagnosis not present

## 2024-04-18 DIAGNOSIS — Z1331 Encounter for screening for depression: Secondary | ICD-10-CM | POA: Diagnosis not present

## 2024-04-18 DIAGNOSIS — I10 Essential (primary) hypertension: Secondary | ICD-10-CM | POA: Diagnosis not present

## 2024-04-18 DIAGNOSIS — R82998 Other abnormal findings in urine: Secondary | ICD-10-CM | POA: Diagnosis not present

## 2024-04-18 DIAGNOSIS — Z1339 Encounter for screening examination for other mental health and behavioral disorders: Secondary | ICD-10-CM | POA: Diagnosis not present

## 2024-04-18 DIAGNOSIS — Z0000 Encounter for general adult medical examination without abnormal findings: Secondary | ICD-10-CM | POA: Diagnosis not present

## 2024-06-06 DIAGNOSIS — G4733 Obstructive sleep apnea (adult) (pediatric): Secondary | ICD-10-CM | POA: Diagnosis not present

## 2024-09-02 ENCOUNTER — Ambulatory Visit (INDEPENDENT_AMBULATORY_CARE_PROVIDER_SITE_OTHER): Payer: 59 | Admitting: Neurology

## 2024-09-02 ENCOUNTER — Encounter: Payer: Self-pay | Admitting: Neurology

## 2024-09-02 VITALS — BP 118/78 | HR 73 | Ht 74.0 in | Wt 251.0 lb

## 2024-09-02 DIAGNOSIS — G4733 Obstructive sleep apnea (adult) (pediatric): Secondary | ICD-10-CM

## 2024-09-02 DIAGNOSIS — R351 Nocturia: Secondary | ICD-10-CM | POA: Diagnosis not present

## 2024-09-02 NOTE — Patient Instructions (Addendum)
   ASSESSMENT AND PLAN :   58 y.o. year old male  here with:    1) OSA well controlled on CPAP,  highly compliant, I can not explain the nocturia,  recommend to see urology 2)  Travel CPAP ordered , set at  8 cm water.  3)  see again in 12 months.

## 2024-09-02 NOTE — Progress Notes (Signed)
 Provider:  Dedra Gores, MD  Primary Care Physician:  Loreli Elsie JONETTA Mickey., MD [address] 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   Chief concern according to patient :  I went on Flomax and it didn't reduce Nocturia, first time up after 2 hours of sleep.  I am going often and have small amounts of urine, no alcohol , no caffeine after 12 noon, and I do dream better.  I get hot at night, palpitations,  not cardiac cause was found. Resting heart rate 55/ h.    He is really sure that its nocturia that wakes him and not the urge to urinate that he becomes aware off after waking up.   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   No gasping for air. He is very compliant , uses a nasal mask with chinstrap and 100% compliant, residual AHI is 0.2/h and 95% pressure at 8 cm water. I will write upon request for a travel CPAP.     Eddie [name] is a 58 y.o. male patient who is here for revisit 09/03/2023 for OSA- CPAP compliance.  Chief concern according to patient :  I now have trouble  again with nocturia, and my bladder is full, I need to void almost every hour. I now take naps'>   Before this started as recently as 3 weeks ago , I was sleeping in intervals of 3 hours.  Flomax was stopped and re-started.  Amlodipine caused edema- will now reduce by 50%  BP is well controlled, but higher in the MD office.  We changed diet, fluid intake, looked at my supplements, and I have still frequent nocturia       The compliance with CPAP is excellent at 100% for days and hours with an average use at time of 7 hours 49 minutes.  The minimum pressure is 7 maximum pressure 18  cm water with 3 cm EPR the residual AHI is of record low at 0.1/h this is more than excellent.  No central apneas arising, 95th percentile pressure is 8 cm water there are minimal leaks 95th percentile is 0.5 L/min.   I doubt that sleep apnea has anything to do with the current resurgence in nocturia.   I would look at salt reduction, reduction in amlodipine, alcohol  intake and caffeine.  Carbonated drinks, water may still contribute.       HISTORY OF PRESENT ILLNESS: 08-29-2022   Presents today for initial cpap use. Set up with [facility]. States he has some good nights and some bad nights. Wakes up during the night due to the machine being on face, still trying to get use to it. Overall quality sleep has improved and when it does work well he feels good. He has tried switching masks to see if that would help. DME: [facility].    Eddie [name] is a 58 y.o.  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  The patient was fitted with a nasal interface but states that he now has an irritation at the bridge of the nose.  He often feels that there is too much pressure but there are several nights where he also has slept pretty much through the night and felt more rested and refreshed in the morning.  The data of his download shows 100% compliance which I find even more interesting as he travel to [place] and sleeping in the meantime, his average usage is 7 hours 49 minutes.  Settings as above AHI residual 0.0/h with a 95th percentile pressure of 9.2 cm water.  Air leaks were minimal 95th percentile air leak is 1.6 L/min.   He is also noted some oral leakage which she has reduced by using a chinstrap.  He also noted an increase in dream sleep activity.  Fatigue score today is 22 points and Epworth sleepiness score is endorsed at 2 out of 24 points.    Review of Systems: Out of a complete 14 system review, the patient complains of only the following symptoms, and all other reviewed systems are negative.:   SLEEPINESS ?  How likely are you to doze in the following situations: 0 = not likely, 1 = slight chance, 2 = moderate chance, 3 = high chance  Sitting and Reading? Watching Television? Sitting inactive in a public place (theater or meeting)? Lying down in the afternoon when circumstances permit? Sitting and talking to someone? Sitting quietly after lunch without alcohol ? In a car, while stopped for a few minutes in traffic? As a passenger in a car for an hour without a break?  Total = 1/ 24   FSS at 17/ 63.        Social History   Socioeconomic History   Marital status: Married    Spouse name: Liselott   Number of children: 1   Years of education: MD   Highest education level: Not on file  Occupational History   Occupation: [employer]  Tobacco Use   Smoking status: Never   Smokeless tobacco: Never   Tobacco comments:    occ cigar  Substance and Sexual Activity   Alcohol  use: Yes    Alcohol /week: 6.0 standard drinks of alcohol     Types: 6 drink(s) per week   Drug use: No   Sexual activity: Not on file  Other Topics Concern   Not on file  Social History Narrative   Live at home with wife and child   Drinks about 1.5 caffeine drinks a day    R handed   Social Drivers of Corporate investment banker Strain: Not on file  Food Insecurity: Not on file  Transportation Needs: Not on file  Physical Activity: Not on file  Stress: Not on file  Social Connections: Not on file    Family History  Problem Relation Age of Onset   Hypertension Father    Hypertension Mother    CAD Neg  Hx     Past Medical History:  Diagnosis Date   Hyperlipidemia    Hypertension    Palpitations    PVCs    No past  surgical history on file.   Current Outpatient Medications on File Prior to Visit  Medication Sig Dispense Refill   amLODipine (NORVASC) 10 MG tablet Take 10 mg by mouth daily.     docusate sodium (COLACE) 100 MG capsule Take 100 mg by mouth daily as needed for mild constipation.     hydrochlorothiazide (HYDRODIURIL) 12.5 MG tablet Take 12.5 mg by mouth every morning.     Ibuprofen 200 MG CAPS Take 400 mg by mouth as needed for pain.     olmesartan (BENICAR) 40 MG tablet Take 40 mg by mouth daily.     rosuvastatin (CRESTOR) 10 MG tablet   10   No current facility-administered medications on file prior to visit.    Allergies  Allergen Reactions   Promethazine      DIAGNOSTIC DATA (LABS, IMAGING, TESTING) - I reviewed patient records, labs, notes, testing and imaging myself where available.  Lab Results  Component Value Date   WBC 4.4 05/14/2023   HGB 13.9 05/14/2023   HCT 41.0 05/14/2023   MCV 91.8 05/14/2023   PLT 179 05/14/2023      Component Value Date/Time   NA 140 05/14/2023 1539   K 3.8 05/14/2023 1539   CL 103 05/14/2023 1539   CO2 24 05/14/2023 1530   GLUCOSE 122 (H) 05/14/2023 1539   BUN 15 05/14/2023 1539   CREATININE 1.10 05/14/2023 1539   CALCIUM 9.2 05/14/2023 1530   PROT 7.2 05/14/2023 1530   ALBUMIN 4.3 05/14/2023 1530   AST 35 05/14/2023 1530   ALT 25 05/14/2023 1530   ALKPHOS 66 05/14/2023 1530   BILITOT 1.5 (H) 05/14/2023 1530   GFRNONAA >60 05/14/2023 1530   No results found for: CHOL, HDL, LDLCALC, LDLDIRECT, TRIG, CHOLHDL No results found for: YHAJ8R No results found for: VITAMINB12 No results found for: TSH  PHYSICAL EXAM:  Vitals:   09/02/24 1034  BP: 118/78  Pulse: 73   No data found. Body mass index is 32.23 kg/m.   Wt Readings from Last 3 Encounters:  09/02/24 251 lb (113.9 kg)  09/03/23  249 lb (112.9 kg)  08/29/22 247 lb (112 kg)     Ht Readings from Last 3 Encounters:  09/02/24 6' 2 (1.88 m)  09/03/23 6' 2 (1.88 m)  08/29/22 6' 2 (1.88 m)      General: The patient is awake, alert and appears not in acute distress and groomed. Head: Normocephalic, atraumatic.  Neck is supple.  Mallampati 2,  neck circumference:16.75 inches . Nasal airflow patent.   Retrognathia is seen.  Dental status: intact  Cardiovascular:  Regular rate and cardiac rhythm by pulse,  without distended neck veins. Respiratory: Lungs are clear to auscultation.  Skin:  Without evidence of ankle edema, or rash. Trunk: The patient's posture is erect.   Neurologic exam : The patient is awake and alert, oriented to place and time.   Memory subjective described as intact.  Attention span & concentration ability appears normal.  Speech is fluent,  without  dysarthria, dysphonia or aphasia.  Mood and affect are appropriate.   Cranial nerves: no loss of smell or taste reported  Pupils are equal and briskly reactive to light. Funduscopic exam deferred. .  Extraocular movements in vertical and horizontal planes were intact and without nystagmus. No Diplopia. Visual fields by finger perimetry are intact. Hearing was intact to soft voice and finger rubbing.    Facial sensation intact to fine touch.  Facial motor strength is symmetric and tongue  and uvula move midline.  Neck ROM : rotation, tilt and flexion extension were normal for age and shoulder shrug was symmetrical.    Motor exam:  Symmetric bulk, tone and ROM.   Normal tone without cog wheeling, symmetric grip strength . Reflexes: Normal and symmetric throughout.  Babinski's sign is absent bilaterally.  Gait and Station: Normal.   ASSESSMENT AND PLAN :   58 y.o. year old male  here with:    1) OSA well controlled on CPAP,  highly compliant, I can not explain the nocturia,  recommend to see urology   2)  Travel CPAP ordered , set at  8  cm water   3)  see again in 12 months.     I would like to thank  Loreli Elsie JONETTA Mickey., Md [address] [place],  [place] [address] for allowing me to meet with this pleasant patient.   Sleep Clinic Patients are generally offered input on sleep hygiene, life style changes and how to improve compliance with medical treatment where applicable. Review and reiteration of good sleep hygiene measures is offered to any sleep clinic patient, be it in the first consultation or with any follow up visits. Any patient with sleepiness should be cautioned not to drive, work at heights, or operate dangerous or heavy equipment when feeling tired or sleepy. The patient will be seen in follow-up in the sleep clinic at [facility] for discussion of test results, sleep related symptoms and treatment compliance review, further management strategies, etc.   The referring provider will be notified of the test results.   The patient's condition requires frequent monitoring and adjustments in the treatment plan, reflecting the ongoing complexity of care.  This provider is the continuing focal point for all needed services for this condition.  After spending a total time of  35  minutes face to face and time for  history taking, physical and neurologic examination, review of laboratory studies,  personal review of imaging studies, reports and results of other testing and review of referral information / records as far as provided in visit,   Electronically signed by: Dedra Gores, MD 09/02/2024 11:10 AM  [facility] Board certified by The [employer] of Sleep Medicine and Diplomate of the [employer] of [employer]. Board certified In Neurology through the ABPN, Fellow of the [employer] of Neu[employer].

## 2024-09-08 DIAGNOSIS — G4733 Obstructive sleep apnea (adult) (pediatric): Secondary | ICD-10-CM | POA: Diagnosis not present

## 2025-09-03 ENCOUNTER — Ambulatory Visit: Admitting: Neurology
